# Patient Record
Sex: Female | Born: 1990 | Race: Black or African American | Hispanic: No | Marital: Married | Smoking: Never smoker
Health system: Southern US, Community
[De-identification: ages and names within clinical notes are randomized; demographics above are authoritative.]

## PROBLEM LIST (undated history)

## (undated) DIAGNOSIS — O99810 Abnormal glucose complicating pregnancy: Secondary | ICD-10-CM

## (undated) DIAGNOSIS — E876 Hypokalemia: Secondary | ICD-10-CM

## (undated) DIAGNOSIS — J45909 Unspecified asthma, uncomplicated: Secondary | ICD-10-CM

## (undated) DIAGNOSIS — I2699 Other pulmonary embolism without acute cor pulmonale: Secondary | ICD-10-CM

## (undated) DIAGNOSIS — O1490 Unspecified pre-eclampsia, unspecified trimester: Secondary | ICD-10-CM

## (undated) HISTORY — DX: Unspecified asthma, uncomplicated: J45.909

## (undated) HISTORY — PX: TONSILLECTOMY: SUR1361

---

## 2015-02-14 DIAGNOSIS — Z8759 Personal history of other complications of pregnancy, childbirth and the puerperium: Secondary | ICD-10-CM | POA: Insufficient documentation

## 2015-02-14 DIAGNOSIS — Z8619 Personal history of other infectious and parasitic diseases: Secondary | ICD-10-CM | POA: Insufficient documentation

## 2015-02-14 DIAGNOSIS — Z87440 Personal history of urinary (tract) infections: Secondary | ICD-10-CM | POA: Insufficient documentation

## 2015-05-03 DIAGNOSIS — D251 Intramural leiomyoma of uterus: Secondary | ICD-10-CM | POA: Insufficient documentation

## 2015-08-25 ENCOUNTER — Ambulatory Visit (INDEPENDENT_AMBULATORY_CARE_PROVIDER_SITE_OTHER): Payer: PRIVATE HEALTH INSURANCE | Admitting: Physician Assistant

## 2015-08-25 VITALS — BP 120/78 | HR 84 | Temp 98.1°F | Resp 18 | Ht 59.0 in | Wt 157.0 lb

## 2015-08-25 DIAGNOSIS — R350 Frequency of micturition: Secondary | ICD-10-CM | POA: Diagnosis not present

## 2015-08-25 DIAGNOSIS — N390 Urinary tract infection, site not specified: Secondary | ICD-10-CM

## 2015-08-25 LAB — POCT URINALYSIS DIP (MANUAL ENTRY)
BILIRUBIN UA: NEGATIVE
Glucose, UA: NEGATIVE
LEUKOCYTES UA: NEGATIVE
NITRITE UA: POSITIVE — AB
PH UA: 5.5
Spec Grav, UA: >=1.03
Urobilinogen, UA: 0.2

## 2015-08-25 LAB — POC MICROSCOPIC URINALYSIS (UMFC): Mucus: ABSENT

## 2015-08-25 MED ORDER — NITROFURANTOIN MONOHYD MACRO 100 MG PO CAPS: 100.0000 mg | ORAL_CAPSULE | Freq: Two times a day (BID) | ORAL | Status: DC

## 2015-08-25 NOTE — Progress Notes (Signed)
08/25/2015 at 9:48 AM  Olivia Hale / DOB: 1990-05-05 / MRN: [id number]  SUBJECTIVE Patient is a 25 y.o. female who complains of dysuria, urinary frequency, urinary urgency and odor x 4 days. She denies hematuria, flank pain, abdominal pain and vaginal discharge. Has tried water and cranberry juice with minimal relief. Azo pills did help with the burning. Pt has been having more UTI since her last pregnancy, most recent UTI  was 04/2015. Pt not concerned for STIs as she is married, in a monogamous relationship.   She  has a past medical history of Asthma.    Olivia Hale has No Known Allergies. She  reports that she has never smoked. She has never used smokeless tobacco. She reports that she drinks alcohol. She reports that she does not use illicit drugs. She  reports that she currently engages in sexual activity. She reports using the following method of birth control/protection: IUD. The patient  has no past surgical history on file.  Her family history includes Hyperlipidemia in her father; Hypertension in her father and mother.  Review of Systems  Constitutional: Negative for fever and chills.  Gastrointestinal: Negative for nausea, vomiting, abdominal pain and diarrhea.  Musculoskeletal: Negative for myalgias.    OBJECTIVE  Her  height is 4\' 11"  (1.499 m) and weight is 157 lb (71.215 kg). Her oral temperature is 98.1 F (36.7 C). Her blood pressure is 120/78 and her pulse is 84. Her respiration is 18 and oxygen saturation is 99%.  The patient's body mass index is 31.69 kg/(m^2).  Physical Exam  Vitals reviewed. Constitutional: She is oriented to person, place, and time. She appears well-developed and well-nourished.  HENT:  Head: Normocephalic and atraumatic.  Eyes: Conjunctivae are normal.  Neck: Normal range of motion.  Cardiovascular: Normal rate, regular rhythm and normal heart sounds.   Respiratory: Effort normal and breath sounds normal.  GI: Soft. Normal appearance and bowel  sounds are normal. She exhibits no mass. There is no tenderness. There is no CVA tenderness.  Neurological: She is alert and oriented to person, place, and time.  Skin: Skin is warm and dry.  Psychiatric: She has a normal mood and affect.    Results for orders placed or performed in visit on 08/25/15 (from the past 24 hour(s))  POCT urinalysis dipstick     Status: Abnormal   Collection Time: 08/25/15  9:34 AM  Result Value Ref Range   Color, UA orange (A) yellow   Clarity, UA clear clear   Glucose, UA negative negative   Bilirubin, UA small (A) negative   Ketones, POC UA negative negative   Spec Grav, UA >=1.030    Blood, UA trace-lysed (A) negative   pH, UA 5.5    Protein Ur, POC =30 (A) negative   Urobilinogen, UA 0.2    Nitrite, UA Positive (A) Negative   Leukocytes, UA Negative Negative  POCT Microscopic Urinalysis ([facility])     Status: Abnormal   Collection Time: 08/25/15  9:34 AM  Result Value Ref Range   WBC,UR,HPF,POC Moderate (A) None WBC/hpf   RBC,UR,HPF,POC None None RBC/hpf   Bacteria None None, Too numerous to count   Mucus Absent Absent   Epithelial Cells, UR Per Microscopy Moderate (A) None, Too numerous to count cells/hpf    ASSESSMENT & PLAN  Taraja was seen today for dysuria and urinary frequency.  Diagnoses and all orders for this visit:  Urinary frequency -     POCT urinalysis dipstick -  POCT Microscopic Urinalysis ([facility])  Urinary tract infection, site not specified -     nitrofurantoin, macrocrystal-monohydrate, (MACROBID) 100 MG capsule; Take 1 capsule (100 mg total) by mouth 2 (two) times daily.   The patient was advised to call or come back to clinic if she does not see an improvement in symptoms, or worsens with the above plan.   Tenna Delaine, PA-C [facility] 08/25/2015 9:48 AM

## 2015-08-25 NOTE — Patient Instructions (Addendum)
-Return to clinic if symptoms worsen, do not improve, or as needed  Urinary Tract Infection Urinary tract infections (UTIs) can develop anywhere along your urinary tract. Your urinary tract is your body's drainage system for removing wastes and extra water. Your urinary tract includes two kidneys, two ureters, a bladder, and a urethra. Your kidneys are a pair of bean-shaped organs. Each kidney is about the size of your fist. They are located below your ribs, one on each side of your spine. CAUSES Infections are caused by microbes, which are microscopic organisms, including fungi, viruses, and bacteria. These organisms are so small that they can only be seen through a microscope. Bacteria are the microbes that most commonly cause UTIs. SYMPTOMS  Symptoms of UTIs may vary by age and gender of the patient and by the location of the infection. Symptoms in young women typically include a frequent and intense urge to urinate and a painful, burning feeling in the bladder or urethra during urination. Older women and men are more likely to be tired, shaky, and weak and have muscle aches and abdominal pain. A fever may mean the infection is in your kidneys. Other symptoms of a kidney infection include pain in your back or sides below the ribs, nausea, and vomiting. DIAGNOSIS To diagnose a UTI, your caregiver will ask you about your symptoms. Your caregiver will also ask you to provide a urine sample. The urine sample will be tested for bacteria and white blood cells. White blood cells are made by your body to help fight infection. TREATMENT  Typically, UTIs can be treated with medication. Because most UTIs are caused by a bacterial infection, they usually can be treated with the use of antibiotics. The choice of antibiotic and length of treatment depend on your symptoms and the type of bacteria causing your infection. HOME CARE INSTRUCTIONS  If you were prescribed antibiotics, take them exactly as your  caregiver instructs you. Finish the medication even if you feel better after you have only taken some of the medication.  Drink enough water and fluids to keep your urine clear or pale yellow.  Avoid caffeine, tea, and carbonated beverages. They tend to irritate your bladder.  Empty your bladder often. Avoid holding urine for long periods of time.  Empty your bladder before and after sexual intercourse.  After a bowel movement, women should cleanse from front to back. Use each tissue only once. SEEK MEDICAL CARE IF:   You have back pain.  You develop a fever.  Your symptoms do not begin to resolve within 3 days. SEEK IMMEDIATE MEDICAL CARE IF:   You have severe back pain or lower abdominal pain.  You develop chills.  You have nausea or vomiting.  You have continued burning or discomfort with urination. MAKE SURE YOU:   Understand these instructions.  Will watch your condition.  Will get help right away if you are not doing well or get worse.   This information is not intended to replace advice given to you by your health care provider. Make sure you discuss any questions you have with your health care provider.   Document Released: 11/02/2004 Document Revised: 10/14/2014 Document Reviewed: 03/03/2011 Elsevier Interactive Patient Education 2016 Elsevier Inc.  Nitrofurantoin tablets or capsules What is this medicine? NITROFURANTOIN (nye troe fyoor AN toyn) is an antibiotic. It is used to treat urinary tract infections. This medicine may be used for other purposes; ask your health care provider or pharmacist if you have questions. What should I  tell my health care provider before I take this medicine? They need to know if you have any of these conditions: -anemia -diabetes -glucose-6-phosphate dehydrogenase deficiency -kidney disease -liver disease -lung disease -other chronic illness -an unusual or allergic reaction to nitrofurantoin, other antibiotics, other  medicines, foods, dyes or preservatives -pregnant or trying to get pregnant -breast-feeding How should I use this medicine? Take this medicine by mouth with a glass of water. Follow the directions on the prescription label. Take this medicine with food or milk. Take your doses at regular intervals. Do not take your medicine more often than directed. Do not stop taking except on your doctor's advice. Talk to your pediatrician regarding the use of this medicine in children. While this drug may be prescribed for selected conditions, precautions do apply. Overdosage: If you think you have taken too much of this medicine contact a poison control center or emergency room at once. NOTE: This medicine is only for you. Do not share this medicine with others. What if I miss a dose? If you miss a dose, take it as soon as you can. If it is almost time for your next dose, take only that dose. Do not take double or extra doses. What may interact with this medicine? -antacids containing magnesium trisilicate -probenecid -quinolone antibiotics like ciprofloxacin, lomefloxacin, norfloxacin and ofloxacin -sulfinpyrazone This list may not describe all possible interactions. Give your health care provider a list of all the medicines, herbs, non-prescription drugs, or dietary supplements you use. Also tell them if you smoke, drink alcohol, or use illegal drugs. Some items may interact with your medicine. What should I watch for while using this medicine? Tell your doctor or health care professional if your symptoms do not improve or if you get new symptoms. Drink several glasses of water a day. If you are taking this medicine for a long time, visit your doctor for regular checks on your progress. If you are diabetic, you may get a false positive result for sugar in your urine with certain brands of urine tests. Check with your doctor. What side effects may I notice from receiving this medicine? Side effects that you  should report to your doctor or health care professional as soon as possible: -allergic reactions like skin rash or hives, swelling of the face, lips, or tongue -chest pain -cough -difficulty breathing -dizziness, drowsiness -fever or infection -joint aches or pains -pale or blue-tinted skin -redness, blistering, peeling or loosening of the skin, including inside the mouth -tingling, burning, pain, or numbness in hands or feet -unusual bleeding or bruising -unusually weak or tired -yellowing of eyes or skin Side effects that usually do not require medical attention (report to your doctor or health care professional if they continue or are bothersome): -dark urine -diarrhea -headache -loss of appetite -nausea or vomiting -temporary hair loss This list may not describe all possible side effects. Call your doctor for medical advice about side effects. You may report side effects to FDA at 1-800-FDA-1088. Where should I keep my medicine? Keep out of the reach of children. Store at room temperature between 15 and 30 degrees C (59 and 86 degrees F). Protect from light. Throw away any unused medicine after the expiration date. NOTE: This sheet is a summary. It may not cover all possible information. If you have questions about this medicine, talk to your doctor, pharmacist, or health care provider.    2016, Elsevier/Gold Standard. (2007-08-14 15:56:47)   IF you received an x-ray today, you will  receive an Pharmacologist from [facility]. Please contact [facility] at [phone] with questions or concerns regarding your invoice.   IF you received labwork today, you will receive an invoice from [employer]. Please contact [facility] at [phone] with questions or concerns regarding your invoice.   Our billing staff will not be able to assist you with questions regarding bills from these companies.  You will be contacted with the lab results as soon  as they are available. The fastest way to get your results is to activate your My Chart account. Instructions are located on the last page of this paperwork. If you have not heard from [place] regarding the results in 2 weeks, please contact this office.

## 2016-03-19 ENCOUNTER — Encounter (HOSPITAL_COMMUNITY): Payer: Self-pay | Admitting: *Deleted

## 2016-03-19 ENCOUNTER — Inpatient Hospital Stay (HOSPITAL_COMMUNITY)
Admission: AD | Admit: 2016-03-19 | Discharge: 2016-03-19 | Disposition: A | Discharge status: Home / Self Care | Payer: PRIVATE HEALTH INSURANCE | Source: Ambulatory Visit | Attending: Obstetrics & Gynecology | Admitting: Obstetrics & Gynecology

## 2016-03-19 DIAGNOSIS — Z79899 Other long term (current) drug therapy: Secondary | ICD-10-CM | POA: Diagnosis not present

## 2016-03-19 DIAGNOSIS — M549 Dorsalgia, unspecified: Secondary | ICD-10-CM | POA: Diagnosis not present

## 2016-03-19 DIAGNOSIS — N898 Other specified noninflammatory disorders of vagina: Secondary | ICD-10-CM | POA: Diagnosis not present

## 2016-03-19 DIAGNOSIS — N390 Urinary tract infection, site not specified: Secondary | ICD-10-CM | POA: Diagnosis present

## 2016-03-19 DIAGNOSIS — R300 Dysuria: Secondary | ICD-10-CM | POA: Insufficient documentation

## 2016-03-19 LAB — WET PREP, GENITAL
CLUE CELLS WET PREP: NONE SEEN
Sperm: NONE SEEN
Trich, Wet Prep: NONE SEEN
Yeast Wet Prep HPF POC: NONE SEEN

## 2016-03-19 LAB — URINALYSIS, ROUTINE W REFLEX MICROSCOPIC
Bilirubin Urine: NEGATIVE
GLUCOSE, UA: NEGATIVE mg/dL
Ketones, ur: NEGATIVE mg/dL
NITRITE: NEGATIVE
PROTEIN: 100 mg/dL — AB
SPECIFIC GRAVITY, URINE: 1.028 (ref 1.005–1.030)
pH: 5 (ref 5.0–8.0)

## 2016-03-19 LAB — POCT PREGNANCY, URINE: PREG TEST UR: NEGATIVE

## 2016-03-19 MED ORDER — NITROFURANTOIN MONOHYD MACRO 100 MG PO CAPS: 100.0000 mg | ORAL_CAPSULE | Freq: Two times a day (BID) | ORAL | 0 refills | Status: AC

## 2016-03-19 NOTE — MAU Provider Note (Signed)
History     CSN: [id number]  Arrival date and time: 03/19/16 1133   First Provider Initiated Contact with Patient 03/19/16 1203      Chief Complaint  Patient presents with  . Urinary Tract Infection   Non-pregnant female here with dysuria, urine odor, and vaginal discharge. Urinary sx started 1 month ago. She tried increased water and cranberry juice and tabs with no resolution. She also used AZO today. She reports yellow, blood streaked vaginal discharge 2 days ago with malodor. No new sexual partners in 4 years.    Pertinent Gynecological History: Menses: 03/11/16 Bleeding: intermenstrual bleeding Contraception: IUD Sexually transmitted diseases: past history: GC, remote Last pap: normal Date: 2016   Past Medical History:  Diagnosis Date  . Asthma     Past Surgical History:  Procedure Laterality Date  . TONSILLECTOMY      Family History  Problem Relation Age of Onset  . Hypertension Mother   . Hypertension Father   . Hyperlipidemia Father     Social History  Substance Use Topics  . Smoking status: Never Smoker  . Smokeless tobacco: Never Used  . Alcohol use 0.0 oz/week     Comment: occasional    Allergies: No Known Allergies  Prescriptions Prior to Admission  Medication Sig Dispense Refill Last Dose  . acetaminophen (TYLENOL) 500 MG tablet Take 1,000 mg by mouth every 6 (six) hours as needed.   03/19/2016 at 1100  . albuterol (PROAIR HFA) 108 (90 Base) MCG/ACT inhaler Inhale into the lungs.   More than a month at Unknown time  . levonorgestrel (MIRENA) 20 MCG/24HR IUD 1 each by Intrauterine route once.   Taking  . nitrofurantoin, macrocrystal-monohydrate, (MACROBID) 100 MG capsule Take 1 capsule (100 mg total) by mouth 2 (two) times daily. 10 capsule 0     Review of Systems  Constitutional: Negative for fever.  Gastrointestinal: Negative.   Genitourinary: Positive for dysuria, frequency, urgency and vaginal discharge. Negative for hematuria and vaginal  bleeding.  Musculoskeletal: Positive for back pain.   Physical Exam   Blood pressure 139/79, pulse 76, temperature 98.7 F (37.1 C), resp. rate 16, height 5\' 1"  (1.549 m), weight 74.4 kg (164 lb), last menstrual period 03/11/2016.  Physical Exam  Constitutional: She is oriented to person, place, and time. She appears well-developed and well-nourished. No distress.  HENT:  Head: Normocephalic and atraumatic.  Neck: Normal range of motion.  Cardiovascular: Normal rate.   Respiratory: Effort normal.  Genitourinary:  Genitourinary Comments: External: no lesions or erythema Vagina: rugated, parous, scant brown discharge    Musculoskeletal: Normal range of motion.  Neurological: She is alert and oriented to person, place, and time.  Skin: Skin is warm and dry.  Psychiatric: She has a normal mood and affect.   Results for orders placed or performed during the hospital encounter of 03/19/16 (from the past 24 hour(s))  Urinalysis, Routine w reflex microscopic     Status: Abnormal   Collection Time: 03/19/16 11:37 AM  Result Value Ref Range   Color, Urine YELLOW YELLOW   APPearance HAZY (A) CLEAR   Specific Gravity, Urine 1.028 1.005 - 1.030   pH 5.0 5.0 - 8.0   Glucose, UA NEGATIVE NEGATIVE mg/dL   Hgb urine dipstick SMALL (A) NEGATIVE   Bilirubin Urine NEGATIVE NEGATIVE   Ketones, ur NEGATIVE NEGATIVE mg/dL   Protein, ur 100 (A) NEGATIVE mg/dL   Nitrite NEGATIVE NEGATIVE   Leukocytes, UA SMALL (A) NEGATIVE   RBC / HPF TOO  NUMEROUS TO COUNT 0 - 5 RBC/hpf   WBC, UA 6-30 0 - 5 WBC/hpf   Bacteria, UA RARE (A) NONE SEEN   Squamous Epithelial / LPF 6-30 (A) NONE SEEN   Mucous PRESENT   Pregnancy, urine POC     Status: None   Collection Time: 03/19/16 11:52 AM  Result Value Ref Range   Preg Test, Ur NEGATIVE NEGATIVE  Wet prep, genital     Status: Abnormal   Collection Time: 03/19/16 12:10 PM  Result Value Ref Range   Yeast Wet Prep HPF POC NONE SEEN NONE SEEN   Trich, Wet Prep  NONE SEEN NONE SEEN   Clue Cells Wet Prep HPF POC NONE SEEN NONE SEEN   WBC, Wet Prep HPF POC MANY (A) NONE SEEN   Sperm NONE SEEN    MAU Course  Procedures  MDM Labs ordered and reviewed. Suspected UTI based on sx and UA. Wet prep neg, GC/CT pending. Stable for discharge home.  Assessment and Plan   1. Dysuria   2. Vaginal discharge    Discharge home Increase water intake Continue AZO and cranberry Rx Macrobid Follow up with PCP as needed Return to MAU for OBGYN emergencies  Allergies as of 03/19/2016   No Known Allergies     Medication List    TAKE these medications   acetaminophen 500 MG tablet Commonly known as:  TYLENOL Take 1,000 mg by mouth every 6 (six) hours as needed for mild pain, moderate pain or headache.   Cranberry 1000 MG Caps Take 2,000 mg by mouth daily.   levonorgestrel 20 MCG/24HR IUD Commonly known as:  MIRENA 1 each by Intrauterine route once.   nitrofurantoin (macrocrystal-monohydrate) 100 MG capsule Commonly known as:  MACROBID Take 1 capsule (100 mg total) by mouth 2 (two) times daily.   PROAIR HFA 108 (90 Base) MCG/ACT inhaler Generic drug:  albuterol Inhale 1-2 puffs into the lungs every 6 (six) hours as needed for wheezing or shortness of breath.      Julianne Handler, CNM 03/19/2016, 12:04 PM

## 2016-03-19 NOTE — MAU Note (Signed)
Pt presents to MAU with complaints of painful urination, odor to urine with yellow discharge for approximately a month

## 2016-03-19 NOTE — Discharge Instructions (Signed)
Dysuria Introduction Dysuria is pain or discomfort while urinating. The pain or discomfort may be felt in the tube that carries urine out of the bladder (urethra) or in the surrounding tissue of the genitals. The pain may also be felt in the groin area, lower abdomen, and lower back. You may have to urinate frequently or have the sudden feeling that you have to urinate (urgency). Dysuria can affect both men and women, but is more common in women. Dysuria can be caused by many different things, including:  Urinary tract infection in women.  Infection of the kidney or bladder.  Kidney stones or bladder stones.  Certain sexually transmitted infections (STIs), such as chlamydia.  Dehydration.  Inflammation of the vagina.  Use of certain medicines.  Use of certain soaps or scented products that cause irritation. Follow these instructions at home: Watch your dysuria for any changes. The following actions may help to reduce any discomfort you are feeling:  Drink enough fluid to keep your urine clear or pale yellow.  Empty your bladder often. Avoid holding urine for long periods of time.  After a bowel movement or urination, women should cleanse from front to back, using each tissue only once.  Empty your bladder after sexual intercourse.  Take medicines only as directed by your health care provider.  If you were prescribed an antibiotic medicine, finish it all even if you start to feel better.  Avoid caffeine, tea, and alcohol. They can irritate the bladder and make dysuria worse. In men, alcohol may irritate the prostate.  Keep all follow-up visits as directed by your health care provider. This is important.  If you had any tests done to find the cause of dysuria, it is your responsibility to obtain your test results. Ask the lab or department performing the test when and how you will get your results. Talk with your health care provider if you have any questions about your  results. Contact a health care provider if:  You develop pain in your back or sides.  You have a fever.  You have nausea or vomiting.  You have blood in your urine.  You are not urinating as often as you usually do. Get help right away if:  You pain is severe and not relieved with medicines.  You are unable to hold down any fluids.  You or someone else notices a change in your mental function.  You have a rapid heartbeat at rest.  You have shaking or chills.  You feel extremely weak. This information is not intended to replace advice given to you by your health care provider. Make sure you discuss any questions you have with your health care provider. Document Released: 10/22/2003 Document Revised: 07/01/2015 Document Reviewed: 09/18/2013  2017 Elsevier  

## 2016-03-20 LAB — GC/CHLAMYDIA PROBE AMP (~~LOC~~) NOT AT ARMC
Chlamydia: NEGATIVE
NEISSERIA GONORRHEA: NEGATIVE

## 2016-03-21 LAB — URINE CULTURE: Culture: 40000 — AB

## 2016-05-29 ENCOUNTER — Emergency Department (HOSPITAL_COMMUNITY)
Admission: EM | Admit: 2016-05-29 | Discharge: 2016-05-29 | Disposition: A | Discharge status: Home / Self Care | Payer: PRIVATE HEALTH INSURANCE | Attending: Emergency Medicine | Admitting: Emergency Medicine

## 2016-05-29 ENCOUNTER — Encounter (HOSPITAL_COMMUNITY): Payer: Self-pay | Admitting: *Deleted

## 2016-05-29 DIAGNOSIS — L0231 Cutaneous abscess of buttock: Secondary | ICD-10-CM | POA: Diagnosis not present

## 2016-05-29 DIAGNOSIS — R319 Hematuria, unspecified: Secondary | ICD-10-CM | POA: Insufficient documentation

## 2016-05-29 DIAGNOSIS — Z79899 Other long term (current) drug therapy: Secondary | ICD-10-CM | POA: Diagnosis not present

## 2016-05-29 DIAGNOSIS — L0291 Cutaneous abscess, unspecified: Secondary | ICD-10-CM

## 2016-05-29 DIAGNOSIS — N390 Urinary tract infection, site not specified: Secondary | ICD-10-CM | POA: Diagnosis not present

## 2016-05-29 DIAGNOSIS — J45909 Unspecified asthma, uncomplicated: Secondary | ICD-10-CM | POA: Diagnosis not present

## 2016-05-29 LAB — URINALYSIS, ROUTINE W REFLEX MICROSCOPIC
BILIRUBIN URINE: NEGATIVE
Glucose, UA: NEGATIVE mg/dL
Ketones, ur: NEGATIVE mg/dL
Nitrite: NEGATIVE
Protein, ur: 30 mg/dL — AB
SPECIFIC GRAVITY, URINE: 1.03 (ref 1.005–1.030)
pH: 6 (ref 5.0–8.0)

## 2016-05-29 LAB — PREGNANCY, URINE: PREG TEST UR: NEGATIVE

## 2016-05-29 MED ORDER — SULFAMETHOXAZOLE-TRIMETHOPRIM 800-160 MG PO TABS: 1.0000 | ORAL_TABLET | Freq: Two times a day (BID) | ORAL | 0 refills | Status: AC

## 2016-05-29 MED ORDER — CEPHALEXIN 250 MG PO CAPS: 250.0000 mg | ORAL_CAPSULE | Freq: Four times a day (QID) | ORAL | 0 refills | Status: AC

## 2016-05-29 MED ORDER — LIDOCAINE HCL (PF) 1 % IJ SOLN
2.0000 mL | Freq: Once | INTRAMUSCULAR | Status: AC
Start: 2016-05-29 — End: 2016-05-29
  Administered 2016-05-29: 30 mL
  Filled 2016-05-29: qty 30

## 2016-05-29 MED ORDER — OXYCODONE-ACETAMINOPHEN 5-325 MG PO TABS
1.0000 | ORAL_TABLET | Freq: Once | ORAL | Status: AC
  Administered 2016-05-29: 1 via ORAL
  Filled 2016-05-29: qty 1

## 2016-05-29 NOTE — Discharge Instructions (Signed)
Take Keflex and Bactrim (antibiotics) as prescribed--take with food to prevent GI upset. Take Ibuprofen as needed for pain--follow over the counter label instructions. Continue to keep area clean and dry; do not soak for the next 24 hours. Call to schedule a follow up appointment with your primary care doctor within 1 week. Return to ER if you experience fevers, chills, dizziness, chest pain, shortness of breath, abdominal pain, nausea/vomiting/diarrhea, vaginal discharge, vaginal bleeding, increased redness/swelling/pain to area, worsening symptoms, or any additional concerns.

## 2016-05-29 NOTE — ED Notes (Signed)
Pt went to urgent care yesterday where she was dx with a boil on the right side of her perineum. No medications given. This morning pt stated seeing blood when wiping.

## 2016-05-29 NOTE — ED Triage Notes (Signed)
Pt c/o pain in the right labial area. Reports this morning that she wiped and noticed some blood. No purulent drainage. She denies fevers.

## 2016-05-29 NOTE — ED Provider Notes (Signed)
[facility] DEPT Provider Note   CSN: [id number] Arrival date & time: 05/29/16  0204     History   Chief Complaint Chief Complaint  Patient presents with  . Abscess    HPI Olivia Hale is a 26 y.o. female.  Pt is a 26 y/o F with PMH of asthma who presents to ED for pain to area below R labia, onset 3 days ago, tender to palpation, reports went to wipe this morning and noted pus on toilet paper, has tried Ibuprofen and ice with mild relief. Denies hx of abscess or MRSA. Denies vaginal discharge, vaginal bleeding, dysuria, or hematuria. Reports she is sexually active with one female partner, monogamous, does not use protection, denies hx of STDs. Denies fevers, chills, dizziness, CP, SOB, cough, abd pain, n/v/d, dysuria, urinary frequency,  hematuria, vaginal discharge/bleeding, or any additional concerns. IUD in place, LMP approx 3 months ago.       Past Medical History:  Diagnosis Date  . Asthma     Patient Active Problem List   Diagnosis Date Noted  . 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 H/O urinary tract infection 02/14/2015  . H/O previous obstetrical problem 02/14/2015  . Personal history of other infectious and parasitic diseases 02/14/2015    Past Surgical History:  Procedure Laterality Date  . TONSILLECTOMY      OB History    Gravida Para Term Preterm AB Living   5 3 3   2 3    SAB TAB Ectopic Multiple Live Births   2               Home Medications    Prior to Admission medications   Medication Sig Start Date End Date Taking? Authorizing Provider  acetaminophen (TYLENOL) 500 MG tablet Take 1,000 mg by mouth every 6 (six) hours as needed for mild pain, moderate pain or headache.     Historical Provider, MD  albuterol (PROAIR HFA) 108 (90 Base) MCG/ACT inhaler Inhale 1-2 puffs into the lungs every 6 (six) hours as needed for wheezing or shortness of breath.     Historical Provider, MD  cephALEXin (KEFLEX) 250 MG capsule Take 1 capsule (250 mg total) by mouth  4 (four) times daily. 05/29/16   Bernadene Bell Wojeck, NP  Cranberry 1000 MG CAPS Take 2,000 mg by mouth daily.    Historical Provider, MD  levonorgestrel (MIRENA) 20 MCG/24HR IUD 1 each by Intrauterine route once.    Historical Provider, MD  sulfamethoxazole-trimethoprim (BACTRIM DS,SEPTRA DS) 800-160 MG tablet Take 1 tablet by mouth 2 (two) times daily. 05/29/16 06/05/16  Ulice Bold, NP    Family History Family History  Problem Relation Age of Onset  . Hypertension Mother   . Hypertension Father   . Hyperlipidemia Father     Social History Social History  Substance Use Topics  . Smoking status: Never Smoker  . Smokeless tobacco: Never Used  . Alcohol use 0.0 oz/week     Comment: occasional     Allergies   Patient has no known allergies.   Review of Systems Review of Systems  Constitutional: Negative for chills, fever and unexpected weight change.  Respiratory: Negative for cough and shortness of breath.   Cardiovascular: Negative for chest pain.  Gastrointestinal: Negative for abdominal pain, diarrhea, nausea and vomiting.  Genitourinary: Positive for vaginal pain. Negative for dysuria, flank pain, frequency, hematuria, vaginal bleeding and vaginal discharge.  Musculoskeletal: Negative for back pain, neck pain and neck stiffness.  Skin: Negative for rash.  Neurological:  Negative for dizziness, weakness, numbness and headaches.     Physical Exam Updated Vital Signs BP 110/75 (BP Location: Left Arm)   Pulse 96   Temp 98.5 F (36.9 C) (Oral)   Resp 18   Ht 5' (1.524 m)   Wt 73.5 kg   SpO2 99%   BMI 31.64 kg/m   Physical Exam  Constitutional: She is oriented to person, place, and time. She appears well-developed and well-nourished.  HENT:  Head: Normocephalic and atraumatic.  Mouth/Throat: Oropharynx is clear and moist.  Eyes: Conjunctivae and EOM are normal. Pupils are equal, round, and reactive to light.  Neck: Normal range of motion. Neck supple.    Cardiovascular: Normal rate, regular rhythm, normal heart sounds and intact distal pulses.   No murmur heard. Pulmonary/Chest: Effort normal and breath sounds normal. She has no wheezes.  Abdominal: Soft. Bowel sounds are normal. There is no tenderness. There is no rebound and no guarding.  Genitourinary:  Genitourinary Comments: +1.5cm x 1.5cm abscess to R gluteal fold with mild surrounding erythema and induration. +scant active purulent drainage. Deferred internal GU and rectal exam.   Musculoskeletal: Normal range of motion.  Lymphadenopathy:       Right: No inguinal adenopathy present.       Left: No inguinal adenopathy present.  Neurological: She is alert and oriented to person, place, and time.  Strength 5/5 to bilateral UE and LE, gait steady while ambulating around pod.   Skin: Skin is warm and dry.  Psychiatric: She has a normal mood and affect. Her behavior is normal.  Nursing note and vitals reviewed.    ED Treatments / Results  Labs (all labs ordered are listed, but only abnormal results are displayed) Labs Reviewed  URINE CULTURE - Abnormal; Notable for the following:       Result Value   Culture >=100,000 COLONIES/mL ESCHERICHIA COLI (*)    Organism ID, Bacteria ESCHERICHIA COLI (*)    All other components within normal limits  URINALYSIS, ROUTINE W REFLEX MICROSCOPIC - Abnormal; Notable for the following:    APPearance CLOUDY (*)    Hgb urine dipstick SMALL (*)    Protein, ur 30 (*)    Leukocytes, UA SMALL (*)    Bacteria, UA MANY (*)    Squamous Epithelial / LPF 6-30 (*)    All other components within normal limits  AEROBIC CULTURE (SUPERFICIAL SPECIMEN)  PREGNANCY, URINE    EKG  EKG Interpretation None       Radiology No results found.  Procedures .Marland KitchenIncision and Drainage Date/Time: 05/29/2016 8:27 AM Performed by: Ulice Bold Authorized by: Ulice Bold   Consent:    Consent obtained:  Verbal   Consent given by:  Patient   Risks  discussed:  Bleeding, damage to other organs, infection, incomplete drainage and pain   Alternatives discussed:  No treatment, delayed treatment, alternative treatment, observation and referral Universal protocol:    Procedure explained and questions answered to patient or proxy's satisfaction: yes     Relevant documents present and verified: yes     Required blood products, implants, devices, and special equipment available: yes     Site/side marked: yes     Immediately prior to procedure a time out was called: yes     Patient identity confirmed:  Verbally with patient, arm band and hospital-assigned identification number Location:    Type:  Abscess   Size:  1.5cmx1.5cm   Location:  Anogenital   Anogenital location: R gluetal fold.  Pre-procedure details:    Skin preparation:  Betadine Anesthesia (see MAR for exact dosages):    Anesthesia method:  Local infiltration   Local anesthetic:  Lidocaine 1% w/o epi Procedure type:    Complexity:  Simple Procedure details:    Incision types:  Single straight   Incision depth:  Dermal   Scalpel blade:  11   Wound management:  Probed and deloculated, irrigated with saline, extensive cleaning and debrided   Drainage:  Purulent   Drainage amount:  Moderate   Wound treatment:  Wound left open   Packing materials:  None Post-procedure details:    Patient tolerance of procedure:  Tolerated well, no immediate complications    (including critical care time)  Medications Ordered in ED Medications  oxyCODONE-acetaminophen (PERCOCET/ROXICET) 5-325 MG per tablet 1 tablet (1 tablet Oral Given 05/29/16 0800)  lidocaine (PF) (XYLOCAINE) 1 % injection 2 mL (30 mLs Infiltration Given 05/29/16 0800)     Initial Impression / Assessment and Plan / ED Course  I have reviewed the triage vital signs and the nursing notes.  Pertinent labs & imaging results that were available during my care of the patient were reviewed by me and considered in my medical  decision making (see chart for details).     Pt presents to ED for abscess to R gluteal fold with mild surrounding cellulitis. Pt afebrile, not concerned with sepsis. Pt deferred GU exam, will get UA for infection and perform I& D. Plan to Dc with antibiotics and PCP and OBGYN follow up; pt amenable to this plan.   8:28 AM Pt tolerated I&D well, moderate purulent drainage; pending UA.  9:57 AM UA reflux to culture with small leuks, many bacteria; bactrim for abscess will cover UTI. Discussed results, discharge instructions, rx and safety, wound care, s/s infection, return precautions, and follow up. Pt verbalizes understanding using verbal teachback and agrees with plan, denies any additional concerns.    Final Clinical Impressions(s) / ED Diagnoses   Final diagnoses:  Abscess  Urinary tract infection with hematuria, site unspecified    New Prescriptions Discharge Medication List as of 05/29/2016  9:59 AM    START taking these medications   Details  cephALEXin (KEFLEX) 250 MG capsule Take 1 capsule (250 mg total) by mouth 4 (four) times daily., Starting Mon 05/29/2016, Print    sulfamethoxazole-trimethoprim (BACTRIM DS,SEPTRA DS) 800-160 MG tablet Take 1 tablet by mouth 2 (two) times daily., Starting Mon 05/29/2016, Until Mon 06/05/2016, [place], NP 05/31/16 2334    Virgel Manifold, MD 06/01/16 1515

## 2016-05-29 NOTE — ED Notes (Signed)
Bed: WA05 Expected date:  Expected time:  Means of arrival:  Comments: 

## 2016-05-31 LAB — AEROBIC CULTURE  (SUPERFICIAL SPECIMEN): CULTURE: NORMAL

## 2016-05-31 LAB — AEROBIC CULTURE W GRAM STAIN (SUPERFICIAL SPECIMEN)

## 2016-05-31 LAB — URINE CULTURE

## 2016-06-01 ENCOUNTER — Telehealth: Payer: Self-pay | Admitting: *Deleted

## 2016-06-01 NOTE — Telephone Encounter (Signed)
Post ED Visit - Positive Culture Follow-up  Culture report reviewed by antimicrobial stewardship pharmacist:  []  Elenor Quinones, Pharm.D. [x]  Heide Guile, Pharm.D., BCPS AQ-ID []  Parks Neptune, Pharm.D., BCPS []  Alycia Rossetti, Pharm.D., BCPS []  [place], [place].D., BCPS, AAHIVP []  Legrand Como, Pharm.D., BCPS, AAHIVP []  Salome Arnt, PharmD, BCPS []  Dimitri Ped, PharmD, BCPS []  Vincenza Hews, PharmD, BCPS  Positive urine culture Treated with Cephalexin and Sulfamethoxazole-Trimethoprim, organism sensitive to the same and no further patient follow-up is required at this time.  Harlon Flor [facility] 06/01/2016, 12:18 PM

## 2018-02-06 NOTE — L&D Delivery Note (Signed)
Delivery Summary    Patient: Debra Warren MRN: [id number]  SSN: [id number]    Date of Birth: 04/08/1990  Age: 28 y.o.  Sex: female        Information for the patient's newborn:  Kray, GIRL  Debra C [[id number]]       Labor Events:   Preterm Labor: No   Antenatal Steroids:     Cervical Ripening Date/Time: 02/03/2019 1:50 PM   Cervical Ripening Type: Misoprostol   Antibiotics During Labor:     Rupture Identifier:      Rupture Date/Time: 02/03/2019 1:50 PM   Rupture Type: AROM   Amniotic Fluid Volume: Large    Amniotic Fluid Description:      Amniotic Fluid Odor:      Induction: Oxytocin       Induction Date/Time: 02/02/2019 5:50 AM    Indications for Induction: Mild Preeclampsia    Augmentation:     Augmentation Date/Time:      Indications for Augmentation:     Labor complications: Other (comment)   cord prolapse   Additional complications:        Delivery Events:  Indications For Episiotomy:     Episiotomy: None   Perineal Laceration(s): None   Repaired:     Periurethral Laceration Location:      Repaired:     Labial Laceration Location:     Repaired:     Sulcal Laceration Location:     Repaired:     Vaginal Laceration Location:     Repaired:     Cervical Laceration Location:     Repaired:     Repair Suture: None   Number of Repair Packets:     Estimated Blood Loss (ml):  ml   Quantitaive Blood Loss (ml):             Delivery Date: 02/03/2019    Delivery Time: 2:03 PM   Delivery Type: C-Section, Low Transverse    C-Section Details    Trial of Labor: No   Primary/Repeat: Primary   Priority: Emergency   Indications:  Cord Prolapse       Sex:  Female     Gestational Age: [redacted]w[redacted]d  Delivery Clinician:  Zeda Gangwer A Joyia Riehle  Living Status: Living   Delivery Location: L&D  OR OR 2          APGARS  One minute Five minutes Ten minutes   Skin color: 0   1   1     Heart rate: 2   2   2     Grimace: 1   2   2     Muscle tone: 0   0   1     Breathing: 0   2   2     Totals: 3   7   8       Presentation: Compound    Position:         Resuscitation Method:  Suctioning-bulb;Tactile Stimulation;PPV;Oxygen     Meconium Stained:        Cord Information: 3 Vessels  Complications: Prolapsed  Cord around:    Delayed cord clamping?    Cord clamped date/time:02/03/2019  2:03 PM  Disposition of Cord Blood: Lab    Blood Gases Sent?: Yes    Placenta:  Date/Time: 02/03/2019  2:05 PM  Removal: Expressed      Appearance: Normal;Intact     Newborn Measurements:  Birth Weight:        Birth Length:          Head Circumference:        Chest Circumference:       Abdominal Girth:      Other Providers:   Sharin Altidor A;HUGGINS, SARAH;LEE, JANET;MILLER, SUSAN;KLAPPERICH, MARK A;DZHUR, OKSANA P;ARNETT, WHITNEY;CASTEEL, KRISTEN M, Obstetrician;Primary Nurse;Primary Newborn Nurse;Neonatologist;Crna;Respiratory Therapist;Charge Nurse;Staff Nurse             Group B Strep: No results found for: GRBSEXT, GRBSEXT  Information for the patient's newborn:  Rekha, Hobbins [[id number]]     Lab Results   Component Value Date/Time    ABO/Rh(D) A POSITIVE 02/03/2019 02:03 PM    DAT IgG NEG 02/03/2019 02:03 PM      Recent Labs     02/03/19  1425 02/03/19  1424   PCO2CB 47 130*   PO2CB 46 <5   HCO3I  --  23.0   SO2I  --  Cannot be calculated   IBD 4 13   SPECTI VENOUS CORD ARTERIAL CORD   PHICB 7.30 6.86*   ISITE CORD CORD   IDEV ROOM AIR ROOM AIR   IALLEN NOT APPLICABLE NOT APPLICABLE

## 2018-02-06 NOTE — L&D Delivery Note (Signed)
Delivery Summary    Patient: Debra Warren MRN: [id number]  SSN: [id number]    Date of Birth: [birth year]  Age: 28 y.o.  Sex: female        Information for the patient's newborn:  Karah, Caruthers [[id number]]       Labor Events:   Preterm Labor: No   Antenatal Steroids:     Cervical Ripening Date/Time: 02/03/2019 1:50 PM   Cervical Ripening Type: Misoprostol   Antibiotics During Labor:     Rupture Identifier:      Rupture Date/Time: 02/03/2019 1:50 PM   Rupture Type: AROM   Amniotic Fluid Volume: Large    Amniotic Fluid Description:      Amniotic Fluid Odor:      Induction: Oxytocin       Induction Date/Time: 02/02/2019 5:50 AM    Indications for Induction: Mild Preeclampsia    Augmentation:     Augmentation Date/Time:      Indications for Augmentation:     Labor complications: Other (comment)   cord prolapse   Additional complications:        Delivery Events:  Indications For Episiotomy:     Episiotomy: None   Perineal Laceration(s): None   Repaired:     Periurethral Laceration Location:      Repaired:     Labial Laceration Location:     Repaired:     Sulcal Laceration Location:     Repaired:     Vaginal Laceration Location:     Repaired:     Cervical Laceration Location:     Repaired:     Repair Suture: None   Number of Repair Packets:     Estimated Blood Loss (ml):  ml   Quantitaive Blood Loss (ml):             Delivery Date: 02/03/2019    Delivery Time: 2:03 PM   Delivery Type: C-Section, Low Transverse    C-Section Details    Trial of Labor: No   Primary/Repeat: Primary   Priority: Emergency   Indications:  Cord Prolapse       Sex:  Female     Gestational Age: [redacted]w[redacted]d  Delivery Clinician:  Adela Ports  Living Status: Living   Delivery Location: L&D  OR OR 2          APGARS  One minute Five minutes Ten minutes   Skin color: 0   1   1     Heart rate: 2   2   2      Grimace: 1   2   2      Muscle tone: 0   0   1     Breathing: 0   2   2     Totals: 3   7   8        Presentation: Compound    Position:         Resuscitation Method:  Suctioning-bulb;Tactile Stimulation;PPV;Oxygen     Meconium Stained:        Cord Information: 3 Vessels  Complications: Prolapsed  Cord around:    Delayed cord clamping?    Cord clamped date/time:02/03/2019  2:03 PM  Disposition of Cord Blood: Lab    Blood Gases Sent?: Yes    Placenta:  Date/Time: 02/03/2019  2:05 PM  Removal: Expressed      Appearance: Normal;Intact     Newborn Measurements:  Birth Weight:        Birth Length:  Head Circumference:        Chest Circumference:       Abdominal Girth:      Other Providers:   Sharin Altidor A;HUGGINS, SARAH;LEE, JANET;MILLER, SUSAN;KLAPPERICH, MARK A;DZHUR, OKSANA P;ARNETT, WHITNEY;CASTEEL, KRISTEN M, Obstetrician;Primary Nurse;Primary Newborn Nurse;Neonatologist;Crna;Respiratory Therapist;Charge Nurse;Staff Nurse             Group B Strep: No results found for: GRBSEXT, GRBSEXT  Information for the patient's newborn:  Rekha, Hobbins [[id number]]     Lab Results   Component Value Date/Time    ABO/Rh(D) A POSITIVE 02/03/2019 02:03 PM    DAT IgG NEG 02/03/2019 02:03 PM      Recent Labs     02/03/19  1425 02/03/19  1424   PCO2CB 47 130*   PO2CB 46 <5   HCO3I  --  23.0   SO2I  --  Cannot be calculated   IBD 4 13   SPECTI VENOUS CORD ARTERIAL CORD   PHICB 7.30 6.86*   ISITE CORD CORD   IDEV ROOM AIR ROOM AIR   IALLEN NOT APPLICABLE NOT APPLICABLE

## 2018-09-09 ENCOUNTER — Other Ambulatory Visit: Attending: Women's Health

## 2018-09-09 ENCOUNTER — Other Ambulatory Visit: Admit: 2018-09-09 | Discharge: 2018-09-09 | Payer: PRIVATE HEALTH INSURANCE | Attending: Women's Health

## 2018-09-09 DIAGNOSIS — O0932 Supervision of pregnancy with insufficient antenatal care, second trimester: Secondary | ICD-10-CM

## 2018-09-09 MED ORDER — ASPIRIN 81 MG TAB, DELAYED RELEASE
81 mg | ORAL_TABLET | Freq: Every day | ORAL | 11 refills | Status: DC
Start: 2018-09-09 — End: 2019-02-04

## 2018-09-09 NOTE — Assessment & Plan Note (Signed)
History reviewed  PNV's ordered (if not already taking)  PN labs, UDS ordered  Genetic testing - CF, SMA, tetra today  D/W pt at length genetic testing that is recommended by ACOG -- NIPT, Quad screen (for trisomies and NTD), CF, SMA.  Brief discussion of these diseases - conditions that may increase risks, etiology, carrier states, fetal effects, treatment options, etc was undertaken. D/W pt that these are screening tests/carrier screening test only and are NOT mandatory. We also discuss false POS/false neg rates. It is her decision whether to have them done and how to proceed with the information afterwards.  All questions answered, pt understood and wishes to proceed with indicated tests.      Problem list reviewed  OB physical completed  OB prenatal packet/education reviewed  RTO 4 weeks with ANA US  PTL/labor precautions, FMC, and pregnancy warning signs reviewed.

## 2018-09-09 NOTE — Progress Notes (Signed)
This is a 28 y.o.  G6 P2123 at [redacted]w[redacted]d for routine OB visit.    Her Estimated Due Date is 02/23/2019, by Last Menstrual Period    Denies leaking of fluid, vaginal bleeding, or regular contractions.Denies severe or persistent HA, vision changes, RUQ/epigastric pain, N/V or swelling.      Current Outpatient Medications on File Prior to Visit   Medication Sig Dispense Refill   ??? albuterol (PROVENTIL HFA, VENTOLIN HFA, PROAIR HFA) 90 mcg/actuation inhaler Take  by inhalation.     ??? PNV no.153/FA/om3/dha/epa/fish (PRENATAL GUMMIES PO) Take  by mouth.       No current facility-administered medications on file prior to visit.        No Known Allergies    OB History   Gravida Para Term Preterm AB Living   6 3 2 1 2 3    SAB TAB Ectopic Molar Multiple Live Births   0 0 0 0 0 3       # 1 - Date: None, Sex: None, Weight: None, GA: None, Delivery: None, Apgar1: None, Apgar5: None, Living: None, Birth Comments: None    # 2 - Date: None, Sex: None, Weight: None, GA: None, Delivery: None, Apgar1: None, Apgar5: None, Living: None, Birth Comments: None    # 3 - Date: 2012, Sex: Female, Weight: 5 lb 9 oz (2.523 kg), GA: [redacted]w[redacted]d, Delivery: Vaginal, Spontaneous, Apgar1: None, Apgar5: None, Living: Living, Birth Comments: None    # 4 - Date: 30, Sex: None, Weight: 6 lb (2.722 kg), GA: [redacted]w[redacted]d, Delivery: Vaginal, Spontaneous, Apgar1: None, Apgar5: None, Living: Living, Birth Comments: None    # 5 - Date: 2017, Sex: None, Weight: 7 lb (3.175 kg), GA: [redacted]w[redacted]d, Delivery: Vaginal, Spontaneous, Apgar1: None, Apgar5: None, Living: Living, Birth Comments: None    # 6 - Date: None, Sex: None, Weight: None, GA: None, Delivery: None, Apgar1: None, Apgar5: None, Living: None, Birth Comments: None        Past Medical History:   Diagnosis Date   ??? Asthma    ??? History of placental abruption    ??? Pre-eclampsia in postpartum period    ??? Preeclampsia        Past Surgical History:   Procedure Laterality Date   ??? HX TONSILLECTOMY         Family History    Problem Relation Age of Onset   ??? Breast Cancer Paternal Aunt    ??? Ovarian Cancer Neg Hx    ??? Uterine Cancer Neg Hx    ??? Colon Cancer Neg Hx        Social History     Socioeconomic History   ??? Marital status: SINGLE     Spouse name: Not on file   ??? Number of children: Not on file   ??? Years of education: Not on file   ??? Highest education level: Not on file   Occupational History   ??? Not on file   Social Needs   ??? Financial resource strain: Not on file   ??? Food insecurity     Worry: Not on file     Inability: Not on file   ??? Transportation needs     Medical: Not on file     Non-medical: Not on file   Tobacco Use   ??? Smoking status: Never Smoker   ??? Smokeless tobacco: Never Used   Substance and Sexual Activity   ??? Alcohol use: Not Currently   ??? Drug use: Never   ???  Sexual activity: Yes   Lifestyle   ??? Physical activity     Days per week: Not on file     Minutes per session: Not on file   ??? Stress: Not on file   Relationships   ??? Social Wellsite geologistconnections     Talks on phone: Not on file     Gets together: Not on file     Attends religious service: Not on file     Active member of club or organization: Not on file     Attends meetings of clubs or organizations: Not on file     Relationship status: Not on file   ??? Intimate partner violence     Fear of current or ex partner: Not on file     Emotionally abused: Not on file     Physically abused: Not on file     Forced sexual activity: Not on file   Other Topics Concern   ??? Military Service Not Asked   ??? Blood Transfusions Not Asked   ??? Caffeine Concern No   ??? Occupational Exposure Not Asked   ??? Hobby Hazards Not Asked   ??? Sleep Concern Not Asked   ??? Stress Concern Not Asked   ??? Weight Concern Not Asked   ??? Special Diet Not Asked   ??? Back Care Not Asked   ??? Exercise No   ??? Bike Helmet Not Asked   ??? Seat Belt Yes   ??? Self-Exams Yes   Social History Narrative    Abuse: Feels safe at home, no history of physical abuse, no history of sexual abuse           Objective    Visit Vitals   BP 122/80 (BP 1 Location: Left arm, BP Patient Position: Sitting)   Temp 98.4 ??F (36.9 ??C) (Temporal)   Wt 175 lb (79.4 kg)       General: well developed, well nourished, in no acute distress    Head: normocephalic and atraumatic    Resp: even and unlabored    Psych: Normal mood and affect        Assessment and Plan      Problem List  Never Reviewed          Codes Class    Multigravida in second trimester ICD-10-CM: Z34.82  ICD-9-CM: V22.1     Overview Signed 09/09/2018  3:23 PM by Vernell Barrieraylor, Creedence Kunesh L, NP     EDD 02/23/2019 by LMP c/w 16wk US             Asthma during pregnancy ICD-10-CM: O99.519, J45.909  ICD-9-CM: 648.93, 493.90     Overview Signed 09/09/2018  3:22 PM by Vernell Barrieraylor, Cosimo Schertzer L, NP     Rare inhaler use (1-2/year)             History of pre-eclampsia in prior pregnancy, currently pregnant in second trimester ICD-10-CM: O09.292  ICD-9-CM: V23.49     Overview Addendum 09/09/2018  3:27 PM by Vernell Barrieraylor, Le Faulcon L, NP     Hx of preeclampsia (requiring delivery at 36 weeks) and a Hx of postpartum preeclampsia  Baseline pre-e labs/urine, Start ASA 162 mg daily             History of placenta abruption ICD-10-CM: Z87.59  ICD-9-CM: V13.29               Problem List Items Addressed This Visit        Respiratory    Asthma during pregnancy  Relevant Medications    albuterol (PROVENTIL HFA, VENTOLIN HFA, PROAIR HFA) 90 mcg/actuation inhaler       Other    History of pre-eclampsia in prior pregnancy, currently pregnant in second trimester    Multigravida in second trimester     History reviewed  PNV's ordered (if not already taking)  PN labs, UDS ordered  Genetic testing - CF, SMA, tetra today   D/W pt at length genetic testing that is recommended by ACOG -- NIPT, Quad screen (for trisomies and NTD), CF, SMA.  Brief discussion of these diseases - conditions that may increase risks, etiology, carrier states, fetal effects, treatment options, etc was undertaken. D/W pt that these are screening tests/carrier screening test only and are NOT mandatory. We also discuss false POS/false neg rates. It is her decision whether to have them done and how to proceed with the information afterwards.  All questions answered, pt understood and wishes to proceed with indicated tests.      Problem list reviewed  OB physical completed  OB prenatal packet/education reviewed  RTO 4 weeks with ANA US  PTL/labor precautions, FMC, and pregnancy warning signs reviewed.             Relevant Orders    ANTIBODY SCREEN    BLOOD TYPE, (ABO+RH)    CBC W/O DIFF    DRUG ABUSE PROF, URINE (SEVEN DRUGS), MS COFIRM    CHLAMYDIA/GC PCR    HEMOGLOBIN A1C WITH EAG    HEMOGLOBIN FRACTIONATION    HEP B SURFACE AG    HIV 1/2 AG/AB, 4TH GENERATION,W RFLX CONFIRM    HEPATITIS C AB    RPR W/REFLEX TITER AND TREPONEMA ABS    RUBELLA AB, IGG    METABOLIC PANEL, COMPREHENSIVE    LD    URIC ACID    PROTEIN, URINE, 24 HR    AFP TETRA SCREEN, OBSTETRICAL    CYSTIC FIBROSIS MUTATION 97    SMN1 COPY NUMBER ANALYSIS      Other Visit Diagnoses     Late prenatal care affecting pregnancy in second trimester    -  Primary    Relevant Orders    AMB POC US OB >= 14 WKS, 1ST GESTATION (Completed)    Encounter to determine fetal viability of pregnancy, single or unspecified fetus        Relevant Orders    AMB POC US OB >= 14 WKS, 1ST GESTATION (Completed)          Orders Placed This Encounter   ??? GC/Chlam   ??? Fetal & Maternal Eval 1st Gest, >14 Weeks   ??? CBC w/o Diff   ??? DRUG ABUSE PROF, URINE (SEVEN DRUGS), MS COFIRM   ??? HEMOGLOBIN A1C WITH EAG   ??? HEMOGLOBIN FRACTIONATION   ??? HEP B SURFACE AG   ??? HIV 1/2 AG/AB w/ Reflex Confirm   ??? HEPATITIS C AB    ??? RPR W/REFLEX TITER AND TREPONEMA ABS   ??? Rubella   ??? CMP   ??? LDH   ??? Uric Acid   ??? 24 Hr. Protein, Urine   ??? AFP Tetra   ??? Cystic Fibrosis   ??? Spinal Muscular Atrophy (SMA) Carrie   ??? Ab Screen   ??? ABO, RH   ??? albuterol (PROVENTIL HFA, VENTOLIN HFA, PROAIR HFA) 90 mcg/actuation inhaler   ??? PNV no.153/FA/om3/dha/epa/fish (PRENATAL GUMMIES PO)   ??? aspirin delayed-release 81 mg tablet       Outpatient Encounter Medications as of 09/09/2018  Medication Sig Dispense Refill   ??? albuterol (PROVENTIL HFA, VENTOLIN HFA, PROAIR HFA) 90 mcg/actuation inhaler Take  by inhalation.     ??? PNV no.153/FA/om3/dha/epa/fish (PRENATAL GUMMIES PO) Take  by mouth.     ??? aspirin delayed-release 81 mg tablet Take 2 Tabs by mouth daily. 60 Tab 11     No facility-administered encounter medications on file as of 09/09/2018.                Labor signs, pregnancy warning signs, and fetal movement counting reviewed (if applicable)        Marcelino Freestone, NP 09/09/18 3:27 PM

## 2018-09-09 NOTE — Progress Notes (Signed)
Patient comes in today for initial prenatal visit. No complaints/concerns today.    Fetal Movements:  No  Contractions:  No  Vaginal Bleeding:  No  Leaking Fluid:  No  GI/GU issues:  No    Drug/Alcohol 4P's Plus Screening    1.  Have either of your parents ever had a problem with drugs/alcohol/prescription drugs? yes  2.  Does your partner have a problem with drugs/alcohol/prescription drugs?  no  3.  In the past, have you ever had a problem with drugs/alcohol/prescription drugs?  no  4.  Before you were pregnant, in the past month, have you done any drugs, drank any alcohol or abused any prescription drugs?  no    If "YES" to any of the above, please give further details:  Patient mother had issue with drug use.    LAST PAP:  6 months ago [facility] on [address]    LAST MAMMO:  Never    LMP:  Patient's last menstrual period was 05/19/2018 (exact date).    BIRTH CONTROL:  none    FAMILY HISTORY OF:   Breast Cancer:  yes   Ovarian Cancer:  no   Uterine Cancer:  no   Colon Cancer:  no    Visit Vitals  BP 122/80 (BP 1 Location: Left arm, BP Patient Position: Sitting)   Temp 98.4 ??F (36.9 ??C) (Temporal)   Wt 175 lb (79.4 kg)       Amoreena Neubert L Clovia Reine  09/09/18  1:59 PM

## 2018-09-09 NOTE — Progress Notes (Signed)
 Patient comes in today for initial prenatal visit. No complaints/concerns today.    Fetal Movements:  No  Contractions:  No  Vaginal Bleeding:  No  Leaking Fluid:  No  GI/GU issues:  No    Drug/Alcohol 4P's Plus Screening    1.  Have either of your parents ever had a problem with drugs/alcohol/prescription drugs? yes  2.  Does your partner have a problem with drugs/alcohol/prescription drugs?  no  3.  In the past, have you ever had a problem with drugs/alcohol/prescription drugs?  no  4.  Before you were pregnant, in the past month, have you done any drugs, drank any alcohol or abused any prescription drugs?  no    If YES to any of the above, please give further details:  Patient mother had issue with drug use.    LAST PAP:  6 months ago [facility] on [address]    LAST MAMMO:  Never    LMP:  Patient's last menstrual period was 05/19/2018 (exact date).    BIRTH CONTROL:  none    FAMILY HISTORY OF:   Breast Cancer:  yes   Ovarian Cancer:  no   Uterine Cancer:  no   Colon Cancer:  no    Visit Vitals  BP 122/80 (BP 1 Location: Left arm, BP Patient Position: Sitting)   Temp 98.4 F (36.9 C) (Temporal)   Wt 175 lb (79.4 kg)       Debra Warren  09/09/18  1:59 PM

## 2018-09-09 NOTE — Assessment & Plan Note (Signed)
History reviewed  PNV's ordered (if not already taking)  PN labs, UDS ordered  Genetic testing - CF, SMA, tetra today  D/W pt at length genetic testing that is recommended by ACOG -- NIPT, Quad screen (for trisomies and NTD), CF, SMA.  Brief discussion of these diseases - conditions that may increase risks, etiology, carrier states, fetal effects, treatment options, etc was undertaken. D/W pt that these are screening tests/carrier screening test only and are NOT mandatory. We also discuss false POS/false neg rates. It is her decision whether to have them done and how to proceed with the information afterwards.  All questions answered, pt understood and wishes to proceed with indicated tests.      Problem list reviewed  OB physical completed  OB prenatal packet/education reviewed  RTO 4 weeks with ANA [place]  PTL/labor precautions, FMC, and pregnancy warning signs reviewed.

## 2018-09-09 NOTE — Progress Notes (Signed)
This is a 28 y.o.  G6 P2123 at 5678w1d for routine OB visit.    Her Estimated Due Date is 02/23/2019, by Last Menstrual Period    Denies leaking of fluid, vaginal bleeding, or regular contractions.Denies severe or persistent HA, vision changes, RUQ/epigastric pain, N/V or swelling.      Current Outpatient Medications on File Prior to Visit   Medication Sig Dispense Refill   ??? albuterol (PROVENTIL HFA, VENTOLIN HFA, PROAIR HFA) 90 mcg/actuation inhaler Take  by inhalation.     ??? PNV no.153/FA/om3/dha/epa/fish (PRENATAL GUMMIES PO) Take  by mouth.       No current facility-administered medications on file prior to visit.        No Known Allergies    OB History   Gravida Para Term Preterm AB Living   6 3 2 1 2 3    SAB TAB Ectopic Molar Multiple Live Births   0 0 0 0 0 3       # 1 - Date: None, Sex: None, Weight: None, GA: None, Delivery: None, Apgar1: None, Apgar5: None, Living: None, Birth Comments: None    # 2 - Date: None, Sex: None, Weight: None, GA: None, Delivery: None, Apgar1: None, Apgar5: None, Living: None, Birth Comments: None    # 3 - Date: 2012, Sex: Female, Weight: 5 lb 9 oz (2.523 kg), GA: 3142w0d, Delivery: Vaginal, Spontaneous, Apgar1: None, Apgar5: None, Living: Living, Birth Comments: None    # 4 - Date: 622015, Sex: None, Weight: 6 lb (2.722 kg), GA: 7595w0d, Delivery: Vaginal, Spontaneous, Apgar1: None, Apgar5: None, Living: Living, Birth Comments: None    # 5 - Date: 2017, Sex: None, Weight: 7 lb (3.175 kg), GA: 4852w0d, Delivery: Vaginal, Spontaneous, Apgar1: None, Apgar5: None, Living: Living, Birth Comments: None    # 6 - Date: None, Sex: None, Weight: None, GA: None, Delivery: None, Apgar1: None, Apgar5: None, Living: None, Birth Comments: None        Past Medical History:   Diagnosis Date   ??? Asthma    ??? History of placental abruption    ??? Pre-eclampsia in postpartum period    ??? Preeclampsia        Past Surgical History:   Procedure Laterality Date   ??? HX TONSILLECTOMY         Family History   Problem  Relation Age of Onset   ??? Breast Cancer Paternal Aunt    ??? Ovarian Cancer Neg Hx    ??? Uterine Cancer Neg Hx    ??? Colon Cancer Neg Hx        Social History     Socioeconomic History   ??? Marital status: SINGLE     Spouse name: Not on file   ??? Number of children: Not on file   ??? Years of education: Not on file   ??? Highest education level: Not on file   Occupational History   ??? Not on file   Social Needs   ??? Financial resource strain: Not on file   ??? Food insecurity     Worry: Not on file     Inability: Not on file   ??? Transportation needs     Medical: Not on file     Non-medical: Not on file   Tobacco Use   ??? Smoking status: Never Smoker   ??? Smokeless tobacco: Never Used   Substance and Sexual Activity   ??? Alcohol use: Not Currently   ??? Drug use: Never   ???  Sexual activity: Yes   Lifestyle   ??? Physical activity     Days per week: Not on file     Minutes per session: Not on file   ??? Stress: Not on file   Relationships   ??? Social Wellsite geologistconnections     Talks on phone: Not on file     Gets together: Not on file     Attends religious service: Not on file     Active member of club or organization: Not on file     Attends meetings of clubs or organizations: Not on file     Relationship status: Not on file   ??? Intimate partner violence     Fear of current or ex partner: Not on file     Emotionally abused: Not on file     Physically abused: Not on file     Forced sexual activity: Not on file   Other Topics Concern   ??? Military Service Not Asked   ??? Blood Transfusions Not Asked   ??? Caffeine Concern No   ??? Occupational Exposure Not Asked   ??? Hobby Hazards Not Asked   ??? Sleep Concern Not Asked   ??? Stress Concern Not Asked   ??? Weight Concern Not Asked   ??? Special Diet Not Asked   ??? Back Care Not Asked   ??? Exercise No   ??? Bike Helmet Not Asked   ??? Seat Belt Yes   ??? Self-Exams Yes   Social History Narrative    Abuse: Feels safe at home, no history of physical abuse, no history of sexual abuse           Objective    Visit Vitals  BP 122/80  (BP 1 Location: Left arm, BP Patient Position: Sitting)   Temp 98.4 ??F (36.9 ??C) (Temporal)   Wt 175 lb (79.4 kg)       General: well developed, well nourished, in no acute distress    Head: normocephalic and atraumatic    Resp: even and unlabored    Psych: Normal mood and affect        Assessment and Plan      Problem List  Never Reviewed          Codes Class    Multigravida in second trimester ICD-10-CM: Z34.82  ICD-9-CM: V22.1     Overview Signed 09/09/2018  3:23 PM by Vernell Barrieraylor, Eldean Nanna L, NP     EDD 02/23/2019 by LMP c/w 16wk US             Asthma during pregnancy ICD-10-CM: O99.519, J45.909  ICD-9-CM: 648.93, 493.90     Overview Signed 09/09/2018  3:22 PM by Vernell Barrieraylor, Kaybree Williams L, NP     Rare inhaler use (1-2/year)             History of pre-eclampsia in prior pregnancy, currently pregnant in second trimester ICD-10-CM: O09.292  ICD-9-CM: V23.49     Overview Addendum 09/09/2018  3:27 PM by Vernell Barrieraylor, Miyonna Ormiston L, NP     Hx of preeclampsia (requiring delivery at 36 weeks) and a Hx of postpartum preeclampsia  Baseline pre-e labs/urine, Start ASA 162 mg daily             History of placenta abruption ICD-10-CM: Z87.59  ICD-9-CM: V13.29               Problem List Items Addressed This Visit        Respiratory    Asthma during pregnancy  Relevant Medications    albuterol (PROVENTIL HFA, VENTOLIN HFA, PROAIR HFA) 90 mcg/actuation inhaler       Other    History of pre-eclampsia in prior pregnancy, currently pregnant in second trimester    Multigravida in second trimester     History reviewed  PNV's ordered (if not already taking)  PN labs, UDS ordered  Genetic testing - CF, SMA, tetra today  D/W pt at length genetic testing that is recommended by ACOG -- NIPT, Quad screen (for trisomies and NTD), CF, SMA.  Brief discussion of these diseases - conditions that may increase risks, etiology, carrier states, fetal effects, treatment options, etc was undertaken. D/W pt that these are screening tests/carrier screening test only and are NOT mandatory.  We also discuss false POS/false neg rates. It is her decision whether to have them done and how to proceed with the information afterwards.  All questions answered, pt understood and wishes to proceed with indicated tests.      Problem list reviewed  OB physical completed  OB prenatal packet/education reviewed  RTO 4 weeks with ANA US  PTL/labor precautions, FMC, and pregnancy warning signs reviewed.             Relevant Orders    ANTIBODY SCREEN    BLOOD TYPE, (ABO+RH)    CBC W/O DIFF    DRUG ABUSE PROF, URINE (SEVEN DRUGS), MS COFIRM    CHLAMYDIA/GC PCR    HEMOGLOBIN A1C WITH EAG    HEMOGLOBIN FRACTIONATION    HEP B SURFACE AG    HIV 1/2 AG/AB, 4TH GENERATION,W RFLX CONFIRM    HEPATITIS C AB    RPR W/REFLEX TITER AND TREPONEMA ABS    RUBELLA AB, IGG    METABOLIC PANEL, COMPREHENSIVE    LD    URIC ACID    PROTEIN, URINE, 24 HR    AFP TETRA SCREEN, OBSTETRICAL    CYSTIC FIBROSIS MUTATION 97    SMN1 COPY NUMBER ANALYSIS      Other Visit Diagnoses     Late prenatal care affecting pregnancy in second trimester    -  Primary    Relevant Orders    AMB POC US OB >= 14 WKS, 1ST GESTATION (Completed)    Encounter to determine fetal viability of pregnancy, single or unspecified fetus        Relevant Orders    AMB POC US OB >= 14 WKS, 1ST GESTATION (Completed)          Orders Placed This Encounter   ??? GC/Chlam   ??? Fetal & Maternal Eval 1st Gest, >14 Weeks   ??? CBC w/o Diff   ??? DRUG ABUSE PROF, URINE (SEVEN DRUGS), MS COFIRM   ??? HEMOGLOBIN A1C WITH EAG   ??? HEMOGLOBIN FRACTIONATION   ??? HEP B SURFACE AG   ??? HIV 1/2 AG/AB w/ Reflex Confirm   ??? HEPATITIS C AB   ??? RPR W/REFLEX TITER AND TREPONEMA ABS   ??? Rubella   ??? CMP   ??? LDH   ??? Uric Acid   ??? 24 Hr. Protein, Urine   ??? AFP Tetra   ??? Cystic Fibrosis   ??? Spinal Muscular Atrophy (SMA) Carrie   ??? Ab Screen   ??? ABO, RH   ??? albuterol (PROVENTIL HFA, VENTOLIN HFA, PROAIR HFA) 90 mcg/actuation inhaler   ??? PNV no.153/FA/om3/dha/epa/fish (PRENATAL GUMMIES PO)   ??? aspirin delayed-release 81  mg tablet       Outpatient Encounter Medications as of 09/09/2018  Medication Sig Dispense Refill   ??? albuterol (PROVENTIL HFA, VENTOLIN HFA, PROAIR HFA) 90 mcg/actuation inhaler Take  by inhalation.     ??? PNV no.153/FA/om3/dha/epa/fish (PRENATAL GUMMIES PO) Take  by mouth.     ??? aspirin delayed-release 81 mg tablet Take 2 Tabs by mouth daily. 60 Tab 11     No facility-administered encounter medications on file as of 09/09/2018.                Labor signs, pregnancy warning signs, and fetal movement counting reviewed (if applicable)        Marcelino Freestone, NP 09/09/18 3:27 PM

## 2018-09-10 ENCOUNTER — Other Ambulatory Visit: Admit: 2018-09-10 | Discharge: 2018-09-10 | Payer: PRIVATE HEALTH INSURANCE

## 2018-09-10 DIAGNOSIS — Z3481 Encounter for supervision of other normal pregnancy, first trimester: Secondary | ICD-10-CM

## 2018-09-11 ENCOUNTER — Encounter: Admit: 2018-09-11 | Discharge: 2018-09-11 | Payer: PRIVATE HEALTH INSURANCE

## 2018-09-12 LAB — RUBELLA AB, IGG
RUBELLA ANTIBODIES, IGG, 006201: 2.93 index (ref >0.99)
Rubella Ab, IgG: 2.93 index (ref >0.99)

## 2018-09-12 LAB — HIV 1/2 ANTIGEN/ANTIBODY, FOURTH GENERATION W/RFL: HIV Screen 4th Generation wRfx: NONREACTIVE

## 2018-09-12 LAB — HEMOGLOBIN FRACTIONATION
HEMOGLOBIN A2: 2.7 % (ref 1.8–3.2)
HEMOGLOBIN F: 0 % (ref 0.0–2.0)
HEMOGLOBIN OTHER: 0 %
HEMOGLOBIN S: 0 %
HGB A: 97.3 % (ref 96.4–98.8)
HGB SOLUBILITY: NEGATIVE
Hemoglobin A2: 2.7 % (ref 1.8–3.2)
Hemoglobin A: 97.3 % (ref 96.4–98.8)
Hemoglobin C: 0 %
Hemoglobin C: 0 %
Hemoglobin F: 0 % (ref 0.0–2.0)
Hemoglobin S: 0 %
Hemoglobin, Other: 0 %
Hgb Solubility: NEGATIVE

## 2018-09-12 LAB — PROTEIN, URINE, 24 HR
PROTEIN TOTAL, URINE: 12.8 mg/dL
PROTEIN, URINE 24 HR, 013219: 128 mg/24 hr (ref 30–150)
Protein total, urine: 12.8 mg/dL
Protein urine, 24 hr calc.: 128 mg/24 hr (ref 30–150)

## 2018-09-12 LAB — COMPREHENSIVE METABOLIC PANEL
ALT: 9 IU/L (ref 0–32)
AST: 13 IU/L (ref 0–40)
Albumin/Globulin Ratio: 1.9 NA (ref 1.2–2.2)
Albumin: 4 g/dL (ref 3.9–5.0)
Alkaline Phosphatase: 50 IU/L (ref 39–117)
BUN: 6 mg/dL (ref 6–20)
Bun/Cre Ratio: 13 NA (ref 9–23)
CO2: 20 mmol/L (ref 20–29)
Calcium: 9.2 mg/dL (ref 8.7–10.2)
Chloride: 104 mmol/L (ref 96–106)
Creatinine: 0.46 mg/dL — ABNORMAL LOW (ref 0.57–1.00)
EGFR IF NonAfrican American: 136 mL/min/{1.73_m2} (ref >59)
GFR African American: 157 mL/min/{1.73_m2} (ref >59)
Globulin, Total: 2.1 g/dL (ref 1.5–4.5)
Glucose: 86 mg/dL (ref 65–99)
Potassium: 3.7 mmol/L (ref 3.5–5.2)
Sodium: 137 mmol/L (ref 134–144)
Total Bilirubin: 0.6 mg/dL (ref 0.0–1.2)
Total Protein: 6.1 g/dL (ref 6.0–8.5)

## 2018-09-12 LAB — LACTATE DEHYDROGENASE: LD: 130 IU/L (ref 119–226)

## 2018-09-12 LAB — CBC
Hematocrit: 34.5 % (ref 34.0–46.6)
Hemoglobin: 12 g/dL (ref 11.1–15.9)
MCH: 28.6 pg (ref 26.6–33.0)
MCHC: 34.8 g/dL (ref 31.5–35.7)
MCV: 82 fL (ref 79–97)
Platelets: 216 10*3/uL (ref 150–450)
RBC: 4.2 x10E6/uL (ref 3.77–5.28)
RDW: 12.9 % (ref 11.7–15.4)
WBC: 6.9 10*3/uL (ref 3.4–10.8)

## 2018-09-12 LAB — HEPATITIS B SURFACE ANTIGEN: Hepatitis B Surface Ag: NEGATIVE

## 2018-09-12 LAB — RPR W/REFLEX TITER AND TREPONEMA ABS
RPR: NONREACTIVE
RPR: NONREACTIVE

## 2018-09-12 LAB — HEPATITIS C ANTIBODY: HCV Ab: <0.1 s/co ratio (ref 0.0–0.9)

## 2018-09-12 LAB — BLOOD TYPE, (ABO+RH)
Rh (D): POSITIVE
Rh Type: POSITIVE

## 2018-09-12 LAB — HEMOGLOBIN A1C W/EAG
Hemoglobin A1C: 5.2 % (ref 4.8–5.6)
eAG: 103 mg/dL

## 2018-09-12 LAB — ANTIBODY SCREEN
Antibody Screen: NEGATIVE
Antibody screen: NEGATIVE

## 2018-09-12 LAB — URIC ACID
Uric Acid, Serum: 3.9 mg/dL (ref 2.5–7.1)
Uric acid: 3.9 mg/dL (ref 2.5–7.1)

## 2018-09-12 LAB — HEP B SURFACE AG: Hep B surface Ag screen: NEGATIVE

## 2018-09-12 LAB — CBC W/O DIFF
HCT: 34.5 % (ref 34.0–46.6)
HGB: 12 g/dL (ref 11.1–15.9)
MCH: 28.6 pg (ref 26.6–33.0)
MCHC: 34.8 g/dL (ref 31.5–35.7)
MCV: 82 fL (ref 79–97)
PLATELET: 216 10*3/uL (ref 150–450)
RBC: 4.2 x10E6/uL (ref 3.77–5.28)
RDW: 12.9 % (ref 11.7–15.4)
WBC: 6.9 10*3/uL (ref 3.4–10.8)

## 2018-09-12 LAB — METABOLIC PANEL, COMPREHENSIVE
A-G Ratio: 1.9 (ref 1.2–2.2)
ALT (SGPT): 9 IU/L (ref 0–32)
AST (SGOT): 13 IU/L (ref 0–40)
Albumin: 4 g/dL (ref 3.9–5.0)
Alk. phosphatase: 50 IU/L (ref 39–117)
BUN/Creatinine ratio: 13 (ref 9–23)
BUN: 6 mg/dL (ref 6–20)
Bilirubin, total: 0.6 mg/dL (ref 0.0–1.2)
CO2: 20 mmol/L (ref 20–29)
Calcium: 9.2 mg/dL (ref 8.7–10.2)
Chloride: 104 mmol/L (ref 96–106)
Creatinine: 0.46 mg/dL — ABNORMAL LOW (ref 0.57–1.00)
GFR est AA: 157 mL/min/{1.73_m2} (ref >59)
GFR est non-AA: 136 mL/min/{1.73_m2} (ref >59)
GLOBULIN, TOTAL: 2.1 g/dL (ref 1.5–4.5)
Glucose: 86 mg/dL (ref 65–99)
Potassium: 3.7 mmol/L (ref 3.5–5.2)
Protein, total: 6.1 g/dL (ref 6.0–8.5)
Sodium: 137 mmol/L (ref 134–144)

## 2018-09-12 LAB — LD: LD: 130 IU/L (ref 119–226)

## 2018-09-12 LAB — HEMOGLOBIN A1C WITH EAG
Estimated average glucose: 103 mg/dL
Hemoglobin A1c: 5.2 % (ref 4.8–5.6)

## 2018-09-12 LAB — HIV 1/2 AG/AB, 4TH GENERATION,W RFLX CONFIRM: HIV SCREEN 4TH GENERATION WRFX: NONREACTIVE

## 2018-09-12 LAB — HEPATITIS C AB: Hep C Virus Ab: <0.1 s/co ratio (ref 0.0–0.9)

## 2018-09-16 LAB — CHLAMYDIA/GC PCR
CHLAMYDIA TRACHOMATIS, NAA, 188078: NEGATIVE
Chlamydia trachomatis, NAA: NEGATIVE
NEISSERIA GONORRHOEAE, NAA, 188086: NEGATIVE
Neisseria gonorrhoeae, NAA: NEGATIVE

## 2018-09-16 LAB — DRUG ABUSE PROF, URINE (SEVEN DRUGS), MS COFIRM
Amphetamines, urine: NEGATIVE ng/mL
Barbiturates: NEGATIVE ng/mL
Benzodiazepines: NEGATIVE ng/mL
Cannabinoids: NEGATIVE ng/mL
Cocaine: NEGATIVE ng/mL
Opiates: NEGATIVE ng/mL
Phencyclidine: NEGATIVE ng/mL

## 2018-09-24 LAB — AFP TETRA SCREEN, OBSTETRICAL
AFP MOM, 017376: 1.19 NA
AFP MoM: 1.19
AFP VALUE, 017375: 39.5 ng/mL
AFP Value: 39.5 ng/mL
DIA MOM, 017382: 0.58 NA
DIA MoM: 0.58
DIA VALUE, 017381: 88.58 pg/mL
DIA Value: 88.58 pg/mL
DSR (2nd Trimester) - 1 in: 10000
DSR (by age) - 1 in: 749
DSR 2ND TRIMESTER  1 IN, 017384: 10000 NA
DSR BY AGE  1 IN, 017385: 749 NA
Gest. Age on Collection Date: 16.2 WEEKS
Gest. Age on Collection Date: 16.2 WEEKS
HCG MOM, 017378: 0.86 NA
HCG VALUE, 017377: 31672 m[IU]/mL
MATERNAL AGE AT EDD, 017370: 29.4 yr
Maternal Age At EDD: 29.4 yr
OSBR Risk  1 In: 10000 NA
OSBR Risk - 1 in: 10000
T18 (by age): 1:2917 {titer}
T18 BY AGE, 017391: 1:2917 {titer}
TEST RESULTS, 017320: NEGATIVE
Test results: NEGATIVE
UE3 MOM, 017380: 0.92 NA
UE3 VALUE, 017379: 0.82 ng/mL
WEIGHT, 017372: 175 [lb_av]
Weight: 175 [lb_av]
hCG MoM: 0.86
hCG Value: 31672 m[IU]/mL
uE3 MoM: 0.92
uE3 Value: 0.82 ng/mL

## 2018-09-24 LAB — CYSTIC FIBROSIS MUTATION 97
INTERPRETATION, 450021: NOT DETECTED
Interpretation: NOT DETECTED

## 2018-09-24 LAB — SMN1 COPY NUMBER ANALYSIS

## 2018-10-07 ENCOUNTER — Ambulatory Visit: Attending: Women's Health

## 2018-10-07 ENCOUNTER — Ambulatory Visit: Admit: 2018-10-07 | Discharge: 2018-10-07 | Payer: PRIVATE HEALTH INSURANCE | Attending: Women's Health

## 2018-10-07 DIAGNOSIS — O09292 Supervision of pregnancy with other poor reproductive or obstetric history, second trimester: Secondary | ICD-10-CM

## 2018-10-07 NOTE — Progress Notes (Signed)
Patient comes in today for routine prenatal visit. No complaints/concerns today.    Fetal Movements:  Yes  Contractions:  No  Vaginal Bleeding:  No  Leaking Fluid:  No  GI/GU issues:  No    Visit Vitals  BP 110/60 (BP 1 Location: Left arm, BP Patient Position: Sitting)   Temp 98.1 ??F (36.7 ??C) (Temporal)   Ht 5' (1.524 m)   Wt 175 lb (79.4 kg)   Breastfeeding No   BMI 34.18 kg/m??         Debra Warren  10/07/18  9:12 AM

## 2018-10-07 NOTE — Progress Notes (Signed)
This is a 28 y.o.  G6 P2123 at 6428w1d for routine OB visit.    Her Estimated Due Date is 02/23/2019, by Last Menstrual Period    Denies leaking of fluid, vaginal bleeding, or regular contractions. Reports fetal movement. Denies severe or persistent HA, vision changes, RUQ/epigastric pain, N/V or swelling.      Current Outpatient Medications on File Prior to Visit   Medication Sig Dispense Refill   ??? albuterol (PROVENTIL HFA, VENTOLIN HFA, PROAIR HFA) 90 mcg/actuation inhaler Take  by inhalation.     ??? PNV no.153/FA/om3/dha/epa/fish (PRENATAL GUMMIES PO) Take  by mouth.     ??? aspirin delayed-release 81 mg tablet Take 2 Tabs by mouth daily. 60 Tab 11     No current facility-administered medications on file prior to visit.        No Known Allergies    OB History   Gravida Para Term Preterm AB Living   6 3 2 1 2 3    SAB TAB Ectopic Molar Multiple Live Births   0 0 0 0 0 3       # 1 - Date: None, Sex: None, Weight: None, GA: None, Delivery: None, Apgar1: None, Apgar5: None, Living: None, Birth Comments: None    # 2 - Date: None, Sex: None, Weight: None, GA: None, Delivery: None, Apgar1: None, Apgar5: None, Living: None, Birth Comments: None    # 3 - Date: 2012, Sex: Female, Weight: 5 lb 9 oz (2.523 kg), GA: 7245w0d, Delivery: Vaginal, Spontaneous, Apgar1: None, Apgar5: None, Living: Living, Birth Comments: None    # 4 - Date: 602015, Sex: None, Weight: 6 lb (2.722 kg), GA: 6077w0d, Delivery: Vaginal, Spontaneous, Apgar1: None, Apgar5: None, Living: Living, Birth Comments: None    # 5 - Date: 2017, Sex: None, Weight: 7 lb (3.175 kg), GA: 1524w0d, Delivery: Vaginal, Spontaneous, Apgar1: None, Apgar5: None, Living: Living, Birth Comments: None    # 6 - Date: None, Sex: None, Weight: None, GA: None, Delivery: None, Apgar1: None, Apgar5: None, Living: None, Birth Comments: None        Past Medical History:   Diagnosis Date   ??? Asthma    ??? History of placental abruption    ??? Pre-eclampsia in postpartum period    ??? Preeclampsia     ??? Umbilical hernia        Past Surgical History:   Procedure Laterality Date   ??? HX TONSILLECTOMY         Family History   Problem Relation Age of Onset   ??? Breast Cancer Paternal Aunt    ??? Ovarian Cancer Neg Hx    ??? Uterine Cancer Neg Hx    ??? Colon Cancer Neg Hx        Social History     Socioeconomic History   ??? Marital status: SINGLE     Spouse name: Not on file   ??? Number of children: Not on file   ??? Years of education: Not on file   ??? Highest education level: Not on file   Occupational History   ??? Not on file   Social Needs   ??? Financial resource strain: Not on file   ??? Food insecurity     Worry: Not on file     Inability: Not on file   ??? Transportation needs     Medical: Not on file     Non-medical: Not on file   Tobacco Use   ??? Smoking status: Never Smoker   ???  Smokeless tobacco: Never Used   Substance and Sexual Activity   ??? Alcohol use: Not Currently   ??? Drug use: Never   ??? Sexual activity: Yes   Lifestyle   ??? Physical activity     Days per week: Not on file     Minutes per session: Not on file   ??? Stress: Not on file   Relationships   ??? Social Product manager on phone: Not on file     Gets together: Not on file     Attends religious service: Not on file     Active member of club or organization: Not on file     Attends meetings of clubs or organizations: Not on file     Relationship status: Not on file   ??? Intimate partner violence     Fear of current or ex partner: Not on file     Emotionally abused: Not on file     Physically abused: Not on file     Forced sexual activity: Not on file   Other Topics Concern   ??? Military Service Not Asked   ??? Blood Transfusions Not Asked   ??? Caffeine Concern No   ??? Occupational Exposure Not Asked   ??? Hobby Hazards Not Asked   ??? Sleep Concern Not Asked   ??? Stress Concern Not Asked   ??? Weight Concern Not Asked   ??? Special Diet Not Asked   ??? Back Care Not Asked   ??? Exercise No   ??? Bike Helmet Not Asked   ??? Seat Belt Yes   ??? Self-Exams Yes   Social History Narrative     Abuse: Feels safe at home, no history of physical abuse, no history of sexual abuse           Objective    Visit Vitals  BP 110/60 (BP 1 Location: Left arm, BP Patient Position: Sitting)   Temp 98.1 ??F (36.7 ??C) (Temporal)   Ht 5' (1.524 m)   Wt 175 lb (79.4 kg)   Breastfeeding No   BMI 34.18 kg/m??       General: well developed, well nourished, in no acute distress    Head: normocephalic and atraumatic    Resp: even and unlabored    Psych: Normal mood and affect        Assessment and Plan      Problem List  Never Reviewed          Codes Class    Multigravida in second trimester ICD-10-CM: Z34.82  ICD-9-CM: V22.1     Overview Addendum 09/24/2018  7:51 AM by Marcelino Freestone, NP     EDD 02/23/2019 by LMP c/w 16wk [place]    09/10/2018: AFP, CF, SMA negative             Asthma during pregnancy ICD-10-CM: O99.519, J45.909  ICD-9-CM: 648.93, 493.90     Overview Signed 09/09/2018  3:22 PM by Marcelino Freestone, NP     Rare inhaler use (1-2/year)             History of pre-eclampsia in prior pregnancy, currently pregnant in second trimester ICD-10-CM: O09.292  ICD-9-CM: V23.49     Overview Addendum 09/13/2018  7:32 AM by Marcelino Freestone, NP     Hx of preeclampsia (requiring delivery at 36 weeks) and a Hx of postpartum preeclampsia  Baseline pre-e labs/urine, Start ASA 162 mg daily    09/11/2018: 24 hr urine protein 158  History of placenta abruption ICD-10-CM: Z87.59  ICD-9-CM: V13.29               Problem List Items Addressed This Visit        Respiratory    Asthma during pregnancy     noted            Other    History of pre-eclampsia in prior pregnancy, currently pregnant in second trimester - Primary     noted         Relevant Orders    AMB POC US OB >= 14 WKS, 1ST GESTATION (Completed)    Multigravida in second trimester     PTL/labor precautions, FMC, and pregnancy warning signs reviewed.  RTO 4 weeks         Relevant Orders    AMB POC US OB >= 14 WKS, 1ST GESTATION (Completed)      Other Visit Diagnoses      Encounter for fetal anatomic survey        Relevant Orders    AMB POC US OB >= 14 WKS, 1ST GESTATION (Completed)          Orders Placed This Encounter   ??? Fetal & Maternal Eval 1st Gest, >14 Weeks       Outpatient Encounter Medications as of 10/07/2018   Medication Sig Dispense Refill   ??? albuterol (PROVENTIL HFA, VENTOLIN HFA, PROAIR HFA) 90 mcg/actuation inhaler Take  by inhalation.     ??? PNV no.153/FA/om3/dha/epa/fish (PRENATAL GUMMIES PO) Take  by mouth.     ??? aspirin delayed-release 81 mg tablet Take 2 Tabs by mouth daily. 60 Tab 11     No facility-administered encounter medications on file as of 10/07/2018.                Labor signs, pregnancy warning signs, and fetal movement counting reviewed (if applicable)        Vernell Barrier, NP 10/07/18 9:14 AM

## 2018-10-07 NOTE — Assessment & Plan Note (Signed)
noted 

## 2018-10-07 NOTE — Assessment & Plan Note (Signed)
PTL/labor precautions, FMC, and pregnancy warning signs reviewed.  RTO 4 weeks

## 2018-10-07 NOTE — Patient Instructions (Signed)
Thank you for coming.  Return to the office in 4 weeks.  Please call if you have any abdominal pain and 5 contractions in 1 hour, vaginal bleeding or spotting, or leaking of fluid.

## 2018-10-07 NOTE — Progress Notes (Signed)
I have reviewed the patient's visit today including history, exam and assessment by Tyra Taylor, WHNP-BC.  I agree with treatment/plan as above.    Debra Warren Robert Kathy Wares, MD  9:32 AM  10/07/18

## 2018-10-07 NOTE — Progress Notes (Signed)
I have reviewed the patient's visit today including history, exam and assessment by Memory Argue, WHNP-BC.  I agree with treatment/plan as above.    Angela Burke, MD  9:32 AM  10/07/18

## 2018-10-07 NOTE — Progress Notes (Signed)
Patient comes in today for routine prenatal visit. No complaints/concerns today.    Fetal Movements:  Yes  Contractions:  No  Vaginal Bleeding:  No  Leaking Fluid:  No  GI/GU issues:  No    Visit Vitals  BP 110/60 (BP 1 Location: Left arm, BP Patient Position: Sitting)   Temp 98.1 F (36.7 C) (Temporal)   Ht 5' (1.524 m)   Wt 175 lb (79.4 kg)   Breastfeeding No   BMI 34.18 kg/m         Debra Warren  10/07/18  9:12 AM

## 2018-10-07 NOTE — Progress Notes (Signed)
This is a 28 y.o.  G6 P2123 at 3236w1d for routine OB visit.    Her Estimated Due Date is 02/23/2019, by Last Menstrual Period    Denies leaking of fluid, vaginal bleeding, or regular contractions. Reports fetal movement. Denies severe or persistent HA, vision changes, RUQ/epigastric pain, N/V or swelling.      Current Outpatient Medications on File Prior to Visit   Medication Sig Dispense Refill   ??? albuterol (PROVENTIL HFA, VENTOLIN HFA, PROAIR HFA) 90 mcg/actuation inhaler Take  by inhalation.     ??? PNV no.153/FA/om3/dha/epa/fish (PRENATAL GUMMIES PO) Take  by mouth.     ??? aspirin delayed-release 81 mg tablet Take 2 Tabs by mouth daily. 60 Tab 11     No current facility-administered medications on file prior to visit.        No Known Allergies    OB History   Gravida Para Term Preterm AB Living   6 3 2 1 2 3    SAB TAB Ectopic Molar Multiple Live Births   0 0 0 0 0 3       # 1 - Date: None, Sex: None, Weight: None, GA: None, Delivery: None, Apgar1: None, Apgar5: None, Living: None, Birth Comments: None    # 2 - Date: None, Sex: None, Weight: None, GA: None, Delivery: None, Apgar1: None, Apgar5: None, Living: None, Birth Comments: None    # 3 - Date: 2012, Sex: Female, Weight: 5 lb 9 oz (2.523 kg), GA: 7448w0d, Delivery: Vaginal, Spontaneous, Apgar1: None, Apgar5: None, Living: Living, Birth Comments: None    # 4 - Date: 312015, Sex: None, Weight: 6 lb (2.722 kg), GA: 3023w0d, Delivery: Vaginal, Spontaneous, Apgar1: None, Apgar5: None, Living: Living, Birth Comments: None    # 5 - Date: 2017, Sex: None, Weight: 7 lb (3.175 kg), GA: 6394w0d, Delivery: Vaginal, Spontaneous, Apgar1: None, Apgar5: None, Living: Living, Birth Comments: None    # 6 - Date: None, Sex: None, Weight: None, GA: None, Delivery: None, Apgar1: None, Apgar5: None, Living: None, Birth Comments: None        Past Medical History:   Diagnosis Date   ??? Asthma    ??? History of placental abruption    ??? Pre-eclampsia in postpartum period    ??? Preeclampsia    ???  Umbilical hernia        Past Surgical History:   Procedure Laterality Date   ??? HX TONSILLECTOMY         Family History   Problem Relation Age of Onset   ??? Breast Cancer Paternal Aunt    ??? Ovarian Cancer Neg Hx    ??? Uterine Cancer Neg Hx    ??? Colon Cancer Neg Hx        Social History     Socioeconomic History   ??? Marital status: SINGLE     Spouse name: Not on file   ??? Number of children: Not on file   ??? Years of education: Not on file   ??? Highest education level: Not on file   Occupational History   ??? Not on file   Social Needs   ??? Financial resource strain: Not on file   ??? Food insecurity     Worry: Not on file     Inability: Not on file   ??? Transportation needs     Medical: Not on file     Non-medical: Not on file   Tobacco Use   ??? Smoking status: Never Smoker   ???  Smokeless tobacco: Never Used   Substance and Sexual Activity   ??? Alcohol use: Not Currently   ??? Drug use: Never   ??? Sexual activity: Yes   Lifestyle   ??? Physical activity     Days per week: Not on file     Minutes per session: Not on file   ??? Stress: Not on file   Relationships   ??? Social Product manager on phone: Not on file     Gets together: Not on file     Attends religious service: Not on file     Active member of club or organization: Not on file     Attends meetings of clubs or organizations: Not on file     Relationship status: Not on file   ??? Intimate partner violence     Fear of current or ex partner: Not on file     Emotionally abused: Not on file     Physically abused: Not on file     Forced sexual activity: Not on file   Other Topics Concern   ??? Military Service Not Asked   ??? Blood Transfusions Not Asked   ??? Caffeine Concern No   ??? Occupational Exposure Not Asked   ??? Hobby Hazards Not Asked   ??? Sleep Concern Not Asked   ??? Stress Concern Not Asked   ??? Weight Concern Not Asked   ??? Special Diet Not Asked   ??? Back Care Not Asked   ??? Exercise No   ??? Bike Helmet Not Asked   ??? Seat Belt Yes   ??? Self-Exams Yes   Social History Narrative     Abuse: Feels safe at home, no history of physical abuse, no history of sexual abuse           Objective    Visit Vitals  BP 110/60 (BP 1 Location: Left arm, BP Patient Position: Sitting)   Temp 98.1 ??F (36.7 ??C) (Temporal)   Ht 5' (1.524 m)   Wt 175 lb (79.4 kg)   Breastfeeding No   BMI 34.18 kg/m??       General: well developed, well nourished, in no acute distress    Head: normocephalic and atraumatic    Resp: even and unlabored    Psych: Normal mood and affect        Assessment and Plan      Problem List  Never Reviewed          Codes Class    Multigravida in second trimester ICD-10-CM: Z34.82  ICD-9-CM: V22.1     Overview Addendum 09/24/2018  7:51 AM by Marcelino Freestone, NP     EDD 02/23/2019 by LMP c/w 16wk [place]    09/10/2018: AFP, CF, SMA negative             Asthma during pregnancy ICD-10-CM: O99.519, J45.909  ICD-9-CM: 648.93, 493.90     Overview Signed 09/09/2018  3:22 PM by Marcelino Freestone, NP     Rare inhaler use (1-2/year)             History of pre-eclampsia in prior pregnancy, currently pregnant in second trimester ICD-10-CM: O09.292  ICD-9-CM: V23.49     Overview Addendum 09/13/2018  7:32 AM by Marcelino Freestone, NP     Hx of preeclampsia (requiring delivery at 36 weeks) and a Hx of postpartum preeclampsia  Baseline pre-e labs/urine, Start ASA 162 mg daily    09/11/2018: 24 hr urine protein 158  History of placenta abruption ICD-10-CM: Z87.59  ICD-9-CM: V13.29               Problem List Items Addressed This Visit        Respiratory    Asthma during pregnancy     noted            Other    History of pre-eclampsia in prior pregnancy, currently pregnant in second trimester - Primary     noted         Relevant Orders    AMB POC US OB >= 14 WKS, 1ST GESTATION (Completed)    Multigravida in second trimester     PTL/labor precautions, [place], and pregnancy warning signs reviewed.  RTO 4 weeks         Relevant Orders    AMB POC US OB >= 14 WKS, 1ST GESTATION (Completed)      Other Visit Diagnoses     Encounter for fetal  anatomic survey        Relevant Orders    AMB POC US OB >= 14 WKS, 1ST GESTATION (Completed)          Orders Placed This Encounter   ??? Fetal & Maternal Eval 1st Gest, >14 Weeks       Outpatient Encounter Medications as of 10/07/2018   Medication Sig Dispense Refill   ??? albuterol (PROVENTIL HFA, VENTOLIN HFA, PROAIR HFA) 90 mcg/actuation inhaler Take  by inhalation.     ??? PNV no.153/FA/om3/dha/epa/fish (PRENATAL GUMMIES PO) Take  by mouth.     ??? aspirin delayed-release 81 mg tablet Take 2 Tabs by mouth daily. 60 Tab 11     No facility-administered encounter medications on file as of 10/07/2018.                Labor signs, pregnancy warning signs, and fetal movement counting reviewed (if applicable)        Marcelino Freestone, NP 10/07/18 9:14 AM

## 2018-10-28 ENCOUNTER — Inpatient Hospital Stay
Admit: 2018-10-28 | Discharge: 2018-10-28 | Disposition: A | Discharge status: Home / Self Care | Payer: PRIVATE HEALTH INSURANCE | Attending: Emergency Medicine

## 2018-10-28 DIAGNOSIS — R5383 Other fatigue: Secondary | ICD-10-CM

## 2018-10-28 LAB — COMPREHENSIVE METABOLIC PANEL
ALT: 36 U/L (ref 12–65)
AST: 23 U/L (ref 15–37)
Albumin/Globulin Ratio: 0.8 — ABNORMAL LOW (ref 1.2–3.5)
Albumin: 3 g/dL — ABNORMAL LOW (ref 3.5–5.0)
Alkaline Phosphatase: 82 U/L (ref 50–136)
Anion Gap: 9 mmol/L (ref 7–16)
BUN: 4 MG/DL — ABNORMAL LOW (ref 6–23)
CO2: 24 mmol/L (ref 21–32)
Calcium: 9.1 MG/DL (ref 8.3–10.4)
Chloride: 106 mmol/L (ref 98–107)
Creatinine: 0.44 MG/DL — ABNORMAL LOW (ref 0.6–1.0)
EGFR IF NonAfrican American: >60 mL/min/{1.73_m2} (ref >60)
GFR African American: >60 mL/min/{1.73_m2} (ref >60)
Globulin: 3.8 g/dL — ABNORMAL HIGH (ref 2.3–3.5)
Glucose: 95 mg/dL (ref 65–100)
Potassium: 2.8 mmol/L — CL (ref 3.5–5.1)
Sodium: 139 mmol/L (ref 136–145)
Total Bilirubin: 0.6 MG/DL (ref 0.2–1.1)
Total Protein: 6.8 g/dL (ref 6.3–8.2)

## 2018-10-28 LAB — CBC WITH AUTO DIFFERENTIAL
Basophils %: 0 % (ref 0.0–2.0)
Basophils Absolute: 0 10*3/uL (ref 0.0–0.2)
Eosinophils %: 1 % (ref 0.5–7.8)
Eosinophils Absolute: 0.1 10*3/uL (ref 0.0–0.8)
Granulocyte Absolute Count: 0.1 10*3/uL (ref 0.0–0.5)
Hematocrit: 35.6 % — ABNORMAL LOW (ref 35.8–46.3)
Hemoglobin: 12 g/dL (ref 11.7–15.4)
Immature Granulocytes: 1 % (ref 0.0–5.0)
Lymphocytes %: 13 % (ref 13–44)
Lymphocytes Absolute: 1.3 10*3/uL (ref 0.5–4.6)
MCH: 28.4 PG (ref 26.1–32.9)
MCHC: 33.7 g/dL (ref 31.4–35.0)
MCV: 84.2 FL (ref 79.6–97.8)
MPV: 10.8 FL (ref 9.4–12.3)
Monocytes %: 8 % (ref 4.0–12.0)
Monocytes Absolute: 0.9 10*3/uL (ref 0.1–1.3)
NRBC Absolute: 0 10*3/uL (ref 0.0–0.2)
Neutrophils %: 77 % (ref 43–78)
Neutrophils Absolute: 8 10*3/uL (ref 1.7–8.2)
Platelets: 241 10*3/uL (ref 150–450)
RBC: 4.23 M/uL (ref 4.05–5.2)
RDW: 12.9 % (ref 11.9–14.6)
WBC: 10.4 10*3/uL (ref 4.3–11.1)

## 2018-10-28 LAB — TROPONIN, HIGH SENSITIVITY: Troponin, High Sensitivity: <3 pg/mL (ref 0–14)

## 2018-10-28 LAB — CBC WITH AUTOMATED DIFF
ABS. BASOPHILS: 0 10*3/uL (ref 0.0–0.2)
ABS. EOSINOPHILS: 0.1 10*3/uL (ref 0.0–0.8)
ABS. IMM. GRANS.: 0.1 10*3/uL (ref 0.0–0.5)
ABS. LYMPHOCYTES: 1.3 10*3/uL (ref 0.5–4.6)
ABS. MONOCYTES: 0.9 10*3/uL (ref 0.1–1.3)
ABS. NEUTROPHILS: 8 10*3/uL (ref 1.7–8.2)
ABSOLUTE NRBC: 0 10*3/uL (ref 0.0–0.2)
BASOPHILS: 0 % (ref 0.0–2.0)
EOSINOPHILS: 1 % (ref 0.5–7.8)
HCT: 35.6 % — ABNORMAL LOW (ref 35.8–46.3)
HGB: 12 g/dL (ref 11.7–15.4)
IMMATURE GRANULOCYTES: 1 % (ref 0.0–5.0)
LYMPHOCYTES: 13 % (ref 13–44)
MCH: 28.4 PG (ref 26.1–32.9)
MCHC: 33.7 g/dL (ref 31.4–35.0)
MCV: 84.2 FL (ref 79.6–97.8)
MONOCYTES: 8 % (ref 4.0–12.0)
MPV: 10.8 FL (ref 9.4–12.3)
NEUTROPHILS: 77 % (ref 43–78)
PLATELET: 241 10*3/uL (ref 150–450)
RBC: 4.23 M/uL (ref 4.05–5.2)
RDW: 12.9 % (ref 11.9–14.6)
WBC: 10.4 10*3/uL (ref 4.3–11.1)

## 2018-10-28 LAB — METABOLIC PANEL, COMPREHENSIVE
A-G Ratio: 0.8 — ABNORMAL LOW (ref 1.2–3.5)
ALT (SGPT): 36 U/L (ref 12–65)
AST (SGOT): 23 U/L (ref 15–37)
Albumin: 3 g/dL — ABNORMAL LOW (ref 3.5–5.0)
Alk. phosphatase: 82 U/L (ref 50–136)
Anion gap: 9 mmol/L (ref 7–16)
BUN: 4 MG/DL — ABNORMAL LOW (ref 6–23)
Bilirubin, total: 0.6 MG/DL (ref 0.2–1.1)
CO2: 24 mmol/L (ref 21–32)
Calcium: 9.1 MG/DL (ref 8.3–10.4)
Chloride: 106 mmol/L (ref 98–107)
Creatinine: 0.44 MG/DL — ABNORMAL LOW (ref 0.6–1.0)
GFR est AA: >60 mL/min/{1.73_m2} (ref >60)
GFR est non-AA: >60 mL/min/{1.73_m2} (ref >60)
Globulin: 3.8 g/dL — ABNORMAL HIGH (ref 2.3–3.5)
Glucose: 95 mg/dL (ref 65–100)
Potassium: 2.8 mmol/L — CL (ref 3.5–5.1)
Protein, total: 6.8 g/dL (ref 6.3–8.2)
Sodium: 139 mmol/L (ref 136–145)

## 2018-10-28 LAB — TROPONIN-HIGH SENSITIVITY: Troponin-High Sensitivity: <3 pg/mL (ref 0–14)

## 2018-10-28 MED ORDER — POTASSIUM CHLORIDE SR 20 MEQ TAB, PARTICLES/CRYSTALS
20 mEq | ORAL | Status: AC
Start: 2018-10-28 — End: 2018-10-28
  Administered 2018-10-28: 23:00:00 via ORAL

## 2018-10-28 MED FILL — POTASSIUM CHLORIDE SR 20 MEQ TAB, PARTICLES/CRYSTALS: 20 mEq | ORAL | Qty: 2

## 2018-10-28 NOTE — ED Provider Notes (Signed)
Patient is a 28 year old female approx [redacted] weeks pregnant presents to the ER this afternoon concerned about fatigue and low blood pressure.  2 days ago she had states her bp was low and again last night.  She monitors it daily because of pre eclampsia with prior pregnancies.  She also reports a dull ache in her left chest since yesterday.  No fever, vomiting or SOB.  No vaginal bleeding or fulid leakage.  She has had prenatal care visits with documented iup    The history is provided by the patient.   Fatigue   This is a new problem. The current episode started 2 days ago. There was no focality noted. There has been no fever. Associated symptoms include chest pain. Pertinent negatives include no shortness of breath, no vomiting, no headaches, no nausea, no bowel incontinence and no bladder incontinence. Associated medical issues do not include trauma, seizures or CVA.   Hypotension    Pertinent negatives include no seizures and no weakness. Her past medical history does not include seizures or CVA.        Past Medical History:   Diagnosis Date   ??? Asthma    ??? History of placental abruption    ??? Pre-eclampsia in postpartum period    ??? Preeclampsia    ??? Umbilical hernia        Past Surgical History:   Procedure Laterality Date   ??? HX TONSILLECTOMY           Family History:   Problem Relation Age of Onset   ??? Breast Cancer Paternal Aunt    ??? Ovarian Cancer Neg Hx    ??? Uterine Cancer Neg Hx    ??? Colon Cancer Neg Hx        Social History     Socioeconomic History   ??? Marital status: SINGLE     Spouse name: Not on file   ??? Number of children: Not on file   ??? Years of education: Not on file   ??? Highest education level: Not on file   Occupational History   ??? Not on file   Social Needs   ??? Financial resource strain: Not on file   ??? Food insecurity     Worry: Not on file     Inability: Not on file   ??? Transportation needs     Medical: Not on file     Non-medical: Not on file   Tobacco Use   ??? Smoking status: Never Smoker    ??? Smokeless tobacco: Never Used   Substance and Sexual Activity   ??? Alcohol use: Not Currently   ??? Drug use: Never   ??? Sexual activity: Yes   Lifestyle   ??? Physical activity     Days per week: Not on file     Minutes per session: Not on file   ??? Stress: Not on file   Relationships   ??? Social Wellsite geologist on phone: Not on file     Gets together: Not on file     Attends religious service: Not on file     Active member of club or organization: Not on file     Attends meetings of clubs or organizations: Not on file     Relationship status: Not on file   ??? Intimate partner violence     Fear of current or ex partner: Not on file     Emotionally abused: Not on file  Physically abused: Not on file     Forced sexual activity: Not on file   Other Topics Concern   ??? Military Service Not Asked   ??? Blood Transfusions Not Asked   ??? Caffeine Concern No   ??? Occupational Exposure Not Asked   ??? Hobby Hazards Not Asked   ??? Sleep Concern Not Asked   ??? Stress Concern Not Asked   ??? Weight Concern Not Asked   ??? Special Diet Not Asked   ??? Back Care Not Asked   ??? Exercise No   ??? Bike Helmet Not Asked   ??? Seat Belt Yes   ??? Self-Exams Yes   Social History Narrative    Abuse: Feels safe at home, no history of physical abuse, no history of sexual abuse         ALLERGIES: Patient has no known allergies.    Review of Systems   Constitutional: Positive for fatigue. Negative for chills and fever.   HENT: Negative for congestion, rhinorrhea and sore throat.    Eyes: Negative for pain, discharge and visual disturbance.   Respiratory: Negative for cough and shortness of breath.    Cardiovascular: Positive for chest pain. Negative for palpitations.   Gastrointestinal: Negative for abdominal pain, bowel incontinence, diarrhea, nausea and vomiting.   Endocrine: Negative for polydipsia and polyuria.   Genitourinary: Negative for bladder incontinence, difficulty urinating, dysuria, frequency, urgency and vaginal bleeding.    Musculoskeletal: Negative for back pain and neck pain.   Skin: Negative for rash and wound.   Neurological: Negative for seizures, syncope, weakness and headaches.   Hematological: Negative.        Vitals:    10/28/18 1653   BP: 128/82   Pulse: (!) 113   Resp: 18   Temp: 98 ??F (36.7 ??C)   SpO2: 100%   Weight: 79.4 kg (175 lb)   Height: 5' (1.524 m)            Physical Exam  Vitals signs and nursing note reviewed.   Constitutional:       Appearance: Normal appearance. She is well-developed.   HENT:      Head: Normocephalic and atraumatic.   Eyes:      Conjunctiva/sclera: Conjunctivae normal.      Pupils: Pupils are equal, round, and reactive to light.   Neck:      Musculoskeletal: Normal range of motion and neck supple.   Cardiovascular:      Rate and Rhythm: Regular rhythm. Tachycardia present.      Pulses: Normal pulses.   Pulmonary:      Effort: Pulmonary effort is normal.      Breath sounds: Normal breath sounds.   Abdominal:      General: There is no distension.      Palpations: Abdomen is soft. There is no mass.      Tenderness: There is no abdominal tenderness. There is no guarding or rebound.   Musculoskeletal: Normal range of motion.         General: No deformity.   Lymphadenopathy:      Cervical: No cervical adenopathy.   Skin:     General: Skin is warm and dry.      Capillary Refill: Capillary refill takes less than 2 seconds.      Coloration: Skin is not pale.      Findings: No rash.   Neurological:      General: No focal deficit present.      Mental Status: She  is alert and oriented to person, place, and time.      GCS: GCS eye subscore is 4. GCS verbal subscore is 5. GCS motor subscore is 6.      Cranial Nerves: No cranial nerve deficit.      Sensory: No sensory deficit.      Motor: No weakness.          MDM  Number of Diagnoses or Management Options  Diagnosis management comments: I wore appropriate PPE throughout this patient's ED visit. Maryjean Ka, MD, 5:34 PM     ECG reviewed by me shows sinus tach at a rate of 103, no arrhythmia or ischemia noted    6:34 PM  Fetal heart tones 140  Urine clean  Potassium slightly low at 2.8, otherwise normal    reassured with normal BP here.      Advised to follow up with her OB    Voice dictation software was used during the making of this note.  This software is not perfect and grammatical and other typographical errors may be present.  This note has been proofread, but may still contain errors.  Maryjean Ka, MD; 10/28/2018 @6 :35 PM   ===================================================================         Amount and/or Complexity of Data Reviewed  Clinical lab tests: ordered and reviewed  Tests in the medicine section of CPT??: ordered and reviewed  Review and summarize past medical records: yes  Independent visualization of images, tracings, or specimens: yes    Risk of Complications, Morbidity, and/or Mortality  Presenting problems: low  Diagnostic procedures: low  Management options: low    Patient Progress  Patient progress: stable         Procedures

## 2018-10-28 NOTE — ED Triage Notes (Signed)
Pt is 23 weeks, c/o low blood pressure since last night with chest discomfort and feeling excessively fatigued. Masked on arrival.

## 2018-10-28 NOTE — ED Notes (Signed)

## 2018-10-28 NOTE — ED Notes (Signed)
I have reviewed discharge instructions with the patient.  The patient verbalized understanding.    Patient left ED via Discharge Method: ambulatory to Home with self.    Opportunity for questions and clarification provided.       Patient given 0 scripts.         To continue your aftercare when you leave the hospital, you may receive an automated call from our care team to check in on how you are doing.  This is a free service and part of our promise to provide the best care and service to meet your aftercare needs." If you have questions, or wish to unsubscribe from this service please call [phone].  Thank you for Choosing our [facility].

## 2018-10-28 NOTE — ED Provider Notes (Signed)
Patient is a 28 year old female approx [redacted] weeks pregnant presents to the ER this afternoon concerned about fatigue and low blood pressure.  2 days ago she had states her bp was low and again last night.  She monitors it daily because of pre eclampsia with prior pregnancies.  She also reports a dull ache in her left chest since yesterday.  No fever, vomiting or SOB.  No vaginal bleeding or fulid leakage.  She has had prenatal care visits with documented iup    The history is provided by the patient.   Fatigue   This is a new problem. The current episode started 2 days ago. There was no focality noted. There has been no fever. Associated symptoms include chest pain. Pertinent negatives include no shortness of breath, no vomiting, no headaches, no nausea, no bowel incontinence and no bladder incontinence. Associated medical issues do not include trauma, seizures or CVA.   Hypotension    Pertinent negatives include no seizures and no weakness. Her past medical history does not include seizures or CVA.        Past Medical History:   Diagnosis Date   ??? Asthma    ??? History of placental abruption    ??? Pre-eclampsia in postpartum period    ??? Preeclampsia    ??? Umbilical hernia        Past Surgical History:   Procedure Laterality Date   ??? HX TONSILLECTOMY           Family History:   Problem Relation Age of Onset   ??? Breast Cancer Paternal Aunt    ??? Ovarian Cancer Neg Hx    ??? Uterine Cancer Neg Hx    ??? Colon Cancer Neg Hx        Social History     Socioeconomic History   ??? Marital status: SINGLE     Spouse name: Not on file   ??? Number of children: Not on file   ??? Years of education: Not on file   ??? Highest education level: Not on file   Occupational History   ??? Not on file   Social Needs   ??? Financial resource strain: Not on file   ??? Food insecurity     Worry: Not on file     Inability: Not on file   ??? Transportation needs     Medical: Not on file     Non-medical: Not on file   Tobacco Use   ??? Smoking status: Never Smoker   ???  Smokeless tobacco: Never Used   Substance and Sexual Activity   ??? Alcohol use: Not Currently   ??? Drug use: Never   ??? Sexual activity: Yes   Lifestyle   ??? Physical activity     Days per week: Not on file     Minutes per session: Not on file   ??? Stress: Not on file   Relationships   ??? Social Product manager on phone: Not on file     Gets together: Not on file     Attends religious service: Not on file     Active member of club or organization: Not on file     Attends meetings of clubs or organizations: Not on file     Relationship status: Not on file   ??? Intimate partner violence     Fear of current or ex partner: Not on file     Emotionally abused: Not on file  Physically abused: Not on file     Forced sexual activity: Not on file   Other Topics Concern   ??? Military Service Not Asked   ??? Blood Transfusions Not Asked   ??? Caffeine Concern No   ??? Occupational Exposure Not Asked   ??? Hobby Hazards Not Asked   ??? Sleep Concern Not Asked   ??? Stress Concern Not Asked   ??? Weight Concern Not Asked   ??? Special Diet Not Asked   ??? Back Care Not Asked   ??? Exercise No   ??? Bike Helmet Not Asked   ??? Seat Belt Yes   ??? Self-Exams Yes   Social History Narrative    Abuse: Feels safe at home, no history of physical abuse, no history of sexual abuse         ALLERGIES: Patient has no known allergies.    Review of Systems   Constitutional: Positive for fatigue. Negative for chills and fever.   HENT: Negative for congestion, rhinorrhea and sore throat.    Eyes: Negative for pain, discharge and visual disturbance.   Respiratory: Negative for cough and shortness of breath.    Cardiovascular: Positive for chest pain. Negative for palpitations.   Gastrointestinal: Negative for abdominal pain, bowel incontinence, diarrhea, nausea and vomiting.   Endocrine: Negative for polydipsia and polyuria.   Genitourinary: Negative for bladder incontinence, difficulty urinating, dysuria, frequency, urgency and vaginal bleeding.   Musculoskeletal:  Negative for back pain and neck pain.   Skin: Negative for rash and wound.   Neurological: Negative for seizures, syncope, weakness and headaches.   Hematological: Negative.        Vitals:    10/28/18 1653   BP: 128/82   Pulse: (!) 113   Resp: 18   Temp: 98 ??F (36.7 ??C)   SpO2: 100%   Weight: 79.4 kg (175 lb)   Height: 5' (1.524 m)            Physical Exam  Vitals signs and nursing note reviewed.   Constitutional:       Appearance: Normal appearance. She is well-developed.   HENT:      Head: Normocephalic and atraumatic.   Eyes:      Conjunctiva/sclera: Conjunctivae normal.      Pupils: Pupils are equal, round, and reactive to light.   Neck:      Musculoskeletal: Normal range of motion and neck supple.   Cardiovascular:      Rate and Rhythm: Regular rhythm. Tachycardia present.      Pulses: Normal pulses.   Pulmonary:      Effort: Pulmonary effort is normal.      Breath sounds: Normal breath sounds.   Abdominal:      General: There is no distension.      Palpations: Abdomen is soft. There is no mass.      Tenderness: There is no abdominal tenderness. There is no guarding or rebound.   Musculoskeletal: Normal range of motion.         General: No deformity.   Lymphadenopathy:      Cervical: No cervical adenopathy.   Skin:     General: Skin is warm and dry.      Capillary Refill: Capillary refill takes less than 2 seconds.      Coloration: Skin is not pale.      Findings: No rash.   Neurological:      General: No focal deficit present.      Mental Status: She  is alert and oriented to person, place, and time.      GCS: GCS eye subscore is 4. GCS verbal subscore is 5. GCS motor subscore is 6.      Cranial Nerves: No cranial nerve deficit.      Sensory: No sensory deficit.      Motor: No weakness.          MDM  Number of Diagnoses or Management Options  Diagnosis management comments: I wore appropriate PPE throughout this patient's ED visit. Maryjean Ka, MD, 5:34 PM    ECG reviewed by me shows sinus tach at a rate of  103, no arrhythmia or ischemia noted    6:34 PM  Fetal heart tones 140  Urine clean  Potassium slightly low at 2.8, otherwise normal    reassured with normal BP here.      Advised to follow up with her OB    Voice dictation software was used during the making of this note.  This software is not perfect and grammatical and other typographical errors may be present.  This note has been proofread, but may still contain errors.  Maryjean Ka, MD; 10/28/2018 @6 :35 PM   ===================================================================         Amount and/or Complexity of Data Reviewed  Clinical lab tests: ordered and reviewed  Tests in the medicine section of CPT??: ordered and reviewed  Review and summarize past medical records: yes  Independent visualization of images, tracings, or specimens: yes    Risk of Complications, Morbidity, and/or Mortality  Presenting problems: low  Diagnostic procedures: low  Management options: low    Patient Progress  Patient progress: stable         Procedures

## 2018-10-28 NOTE — ED Notes (Signed)
Pt is 23 weeks, c/o low blood pressure since last night with chest discomfort and feeling excessively fatigued. Masked on arrival.

## 2018-10-29 LAB — EKG 12-LEAD
Atrial Rate: 103 {beats}/min
P Axis: 33 degrees
P-R Interval: 138 ms
Q-T Interval: 332 ms
QRS Duration: 70 ms
QTc Calculation (Bazett): 434 ms
R Axis: 32 degrees
T Axis: 11 degrees
Ventricular Rate: 103 {beats}/min

## 2018-10-29 LAB — EKG, 12 LEAD, INITIAL
Atrial Rate: 103 {beats}/min
Calculated P Axis: 33 degrees
Calculated R Axis: 32 degrees
Calculated T Axis: 11 degrees
P-R Interval: 138 ms
Q-T Interval: 332 ms
QRS Duration: 70 ms
QTC Calculation (Bezet): 434 ms
Ventricular Rate: 103 {beats}/min

## 2018-11-06 ENCOUNTER — Ambulatory Visit: Attending: Women's Health

## 2018-11-06 ENCOUNTER — Ambulatory Visit: Admit: 2018-11-06 | Discharge: 2018-11-06 | Payer: PRIVATE HEALTH INSURANCE | Attending: Women's Health

## 2018-11-06 DIAGNOSIS — E876 Hypokalemia: Secondary | ICD-10-CM

## 2018-11-06 NOTE — Progress Notes (Signed)
Patient comes in today for routine prenatal visit. Patient states she has episodes 2-3 times a week where when she lays down her heart beats fast, so she will sit up and the symptom subsides. Patient stated the episodes started couple weeks ago.     Fetal Movements:  Yes  Contractions:  No  Vaginal Bleeding:  No  Leaking Fluid:  No  GI/GU issues:  No    Visit Vitals  BP 130/72 (BP 1 Location: Left arm, BP Patient Position: Sitting)   Pulse 98   Temp 97.9 ??F (36.6 ??C) (Temporal)   Ht 5' (1.524 m)   Wt 183 lb (83 kg)   SpO2 99%   Breastfeeding No   BMI 35.74 kg/m??         Elta Angell L Diasia Henken  11/06/18  8:22 AM

## 2018-11-06 NOTE — Assessment & Plan Note (Addendum)
Noted  BP nl today  Denies HA, vision changes, RUQ/epigastric pain, N/V or swelling.

## 2018-11-06 NOTE — Assessment & Plan Note (Signed)
PTL/labor precautions, FMC, and pregnancy warning signs reviewed.  RTO 4 weeks for OBV and glucola

## 2018-11-06 NOTE — Assessment & Plan Note (Signed)
Positional, liely physiologic in pregnancy    We discuss not lying suping, will check TSH/electrolytes today, CBC nl 1 week ago  If persistent, plan referral to cardiology for holter/echo

## 2018-11-06 NOTE — Patient Instructions (Signed)
Thank you for coming.  Return to the office in 4 weeks.  Please call if you have any abdominal pain and 5 contractions in 1 hour, vaginal bleeding or spotting, or leaking of fluid.  Please call immediately if you have a headache that won't go away with tylenol, changes to your vision like funny lights or blurriness, nausea or vomiting, upper abdominal pain, or if the baby does not move at least 10 times in 2 hours.

## 2018-11-06 NOTE — Progress Notes (Signed)
This is a 28 y.o.  G6 P2123 at 9098w3d for routine OB visit.    Her Estimated Due Date is 02/23/2019, by Last Menstrual Period    Denies leaking of fluid, vaginal bleeding, or regular contractions. Reports fetal movement. Denies severe or persistent HA, vision changes, RUQ/epigastric pain, N/V or swelling.    Pt reports in the last 2 weeks she has had 6 episodes of feeling like her heart is racing. Occurs when she is lying down. When she sits up, the palpitations resolve. Denies CP, SOB, or cough. Denies syncope. Denies any cardiac hx. Known asthma, but denies any recent symptoms.       Current Outpatient Medications on File Prior to Visit   Medication Sig Dispense Refill   ??? PNV no.153/FA/om3/dha/epa/fish (PRENATAL GUMMIES PO) Take  by mouth.     ??? aspirin delayed-release 81 mg tablet Take 2 Tabs by mouth daily. 60 Tab 11     No current facility-administered medications on file prior to visit.        No Known Allergies    OB History   Gravida Para Term Preterm AB Living   6 3 2 1 2 3    SAB TAB Ectopic Molar Multiple Live Births   0 0 0 0 0 3       # 1 - Date: None, Sex: None, Weight: None, GA: None, Delivery: None, Apgar1: None, Apgar5: None, Living: None, Birth Comments: None    # 2 - Date: None, Sex: None, Weight: None, GA: None, Delivery: None, Apgar1: None, Apgar5: None, Living: None, Birth Comments: None    # 3 - Date: 2012, Sex: Female, Weight: 5 lb 9 oz (2.523 kg), GA: 2633w0d, Delivery: Vaginal, Spontaneous, Apgar1: None, Apgar5: None, Living: Living, Birth Comments: None    # 4 - Date: 152015, Sex: None, Weight: 6 lb (2.722 kg), GA: 5631w0d, Delivery: Vaginal, Spontaneous, Apgar1: None, Apgar5: None, Living: Living, Birth Comments: None    # 5 - Date: 2017, Sex: None, Weight: 7 lb (3.175 kg), GA: 874w0d, Delivery: Vaginal, Spontaneous, Apgar1: None, Apgar5: None, Living: Living, Birth Comments: None     # 6 - Date: None, Sex: None, Weight: None, GA: None, Delivery: None, Apgar1: None, Apgar5: None, Living: None, Birth Comments: None        Past Medical History:   Diagnosis Date   ??? Asthma    ??? History of placental abruption    ??? Pre-eclampsia in postpartum period    ??? Preeclampsia    ??? Umbilical hernia        Past Surgical History:   Procedure Laterality Date   ??? HX TONSILLECTOMY         Family History   Problem Relation Age of Onset   ??? Breast Cancer Paternal Aunt    ??? Ovarian Cancer Neg Hx    ??? Uterine Cancer Neg Hx    ??? Colon Cancer Neg Hx        Social History     Socioeconomic History   ??? Marital status: SINGLE     Spouse name: Not on file   ??? Number of children: Not on file   ??? Years of education: Not on file   ??? Highest education level: Not on file   Occupational History   ??? Not on file   Social Needs   ??? Financial resource strain: Not on file   ??? Food insecurity     Worry: Not on file     Inability: Not on  file   ??? Transportation needs     Medical: Not on file     Non-medical: Not on file   Tobacco Use   ??? Smoking status: Never Smoker   ??? Smokeless tobacco: Never Used   Substance and Sexual Activity   ??? Alcohol use: Not Currently   ??? Drug use: Never   ??? Sexual activity: Yes   Lifestyle   ??? Physical activity     Days per week: Not on file     Minutes per session: Not on file   ??? Stress: Not on file   Relationships   ??? Social Product manager on phone: Not on file     Gets together: Not on file     Attends religious service: Not on file     Active member of club or organization: Not on file     Attends meetings of clubs or organizations: Not on file     Relationship status: Not on file   ??? Intimate partner violence     Fear of current or ex partner: Not on file     Emotionally abused: Not on file     Physically abused: Not on file     Forced sexual activity: Not on file   Other Topics Concern   ??? Military Service Not Asked   ??? Blood Transfusions Not Asked   ??? Caffeine Concern No    ??? Occupational Exposure Not Asked   ??? Hobby Hazards Not Asked   ??? Sleep Concern Not Asked   ??? Stress Concern Not Asked   ??? Weight Concern Not Asked   ??? Special Diet Not Asked   ??? Back Care Not Asked   ??? Exercise No   ??? Bike Helmet Not Asked   ??? Seat Belt Yes   ??? Self-Exams Yes   Social History Narrative    Abuse: Feels safe at home, no history of physical abuse, no history of sexual abuse           Objective    Visit Vitals  BP 130/72 (BP 1 Location: Left arm, BP Patient Position: Sitting)   Pulse 98   Temp 97.9 ??F (36.6 ??C) (Temporal)   Ht 5' (1.524 m)   Wt 183 lb (83 kg)   SpO2 99%   Breastfeeding No   BMI 35.74 kg/m??       General: well developed, well nourished, in no acute distress    Head: normocephalic and atraumatic    Resp: even and unlabored, lung sounds nl    Heart: RRR, no murmurs noted    Psych: Normal mood and affect        Assessment and Plan      Problem List  Never Reviewed          Codes Class    Hypokalemia ICD-10-CM: E87.6  ICD-9-CM: 276.8     Overview Signed 11/06/2018  8:26 AM by Marcelino Freestone, NP     10/28/2018: K+ 2.8 mEq in ER, received 40 mEQ IV             Heart palpitations ICD-10-CM: R00.2  ICD-9-CM: 785.1         Multigravida in second trimester ICD-10-CM: Z34.82  ICD-9-CM: V22.1     Overview Addendum 09/24/2018  7:51 AM by Marcelino Freestone, NP     EDD 02/23/2019 by LMP c/w 16wk [place]    09/10/2018: AFP, CF, SMA negative  Asthma during pregnancy ICD-10-CM: O99.519, J45.909  ICD-9-CM: 648.93, 493.90     Overview Signed 09/09/2018  3:22 PM by Vernell Barrier, NP     Rare inhaler use (1-2/year)             History of pre-eclampsia in prior pregnancy, currently pregnant in second trimester ICD-10-CM: O09.292  ICD-9-CM: V23.49     Overview Addendum 09/13/2018  7:32 AM by Vernell Barrier, NP     Hx of preeclampsia (requiring delivery at 36 weeks) and a Hx of postpartum preeclampsia  Baseline pre-e labs/urine, Start ASA 162 mg daily    09/11/2018: 24 hr urine protein 158              History of placenta abruption ICD-10-CM: Z87.59  ICD-9-CM: V13.29               Problem List Items Addressed This Visit        Other    Heart palpitations     Positional, liely physiologic in pregnancy    We discuss not lying suping, will check TSH/electrolytes today, CBC nl 1 week ago  If persistent, plan referral to cardiology for holter/echo         History of pre-eclampsia in prior pregnancy, currently pregnant in second trimester     Noted  BP nl today  Denies HA, vision changes, RUQ/epigastric pain, N/V or swelling.           Hypokalemia    Relevant Orders    METABOLIC PANEL, COMPREHENSIVE    TSH REFLEX TO T4    MAGNESIUM    Multigravida in second trimester     PTL/labor precautions, FMC, and pregnancy warning signs reviewed.  RTO 4 weeks for OBV and glucola               Orders Placed This Encounter   ??? CMP   ??? TSH REFLEX TO T4   ??? MAGNESIUM       Outpatient Encounter Medications as of 11/06/2018   Medication Sig Dispense Refill   ??? PNV no.153/FA/om3/dha/epa/fish (PRENATAL GUMMIES PO) Take  by mouth.     ??? aspirin delayed-release 81 mg tablet Take 2 Tabs by mouth daily. 60 Tab 11     No facility-administered encounter medications on file as of 11/06/2018.                Labor signs, pregnancy warning signs, and fetal movement counting reviewed (if applicable)        Vernell Barrier, NP 11/06/18 8:38 AM

## 2018-11-06 NOTE — Progress Notes (Signed)
I have reviewed the patient's visit today including history, exam and assessment by Tyra Taylor, WHNP-BC.  I agree with treatment/plan as above.    Casmir Auguste Robert Yechezkel Fertig, MD  8:59 AM  11/06/18

## 2018-11-06 NOTE — Assessment & Plan Note (Signed)
PTL/labor precautions, FMC, and pregnancy warning signs reviewed.  RTO 4 weeks for OBV and glucola

## 2018-11-06 NOTE — Progress Notes (Signed)
This is a 28 y.o.  G6 P2123 at 1339w3d for routine OB visit.    Her Estimated Due Date is 02/23/2019, by Last Menstrual Period    Denies leaking of fluid, vaginal bleeding, or regular contractions. Reports fetal movement. Denies severe or persistent HA, vision changes, RUQ/epigastric pain, N/V or swelling.    Pt reports in the last 2 weeks she has had 6 episodes of feeling like her heart is racing. Occurs when she is lying down. When she sits up, the palpitations resolve. Denies CP, SOB, or cough. Denies syncope. Denies any cardiac hx. Known asthma, but denies any recent symptoms.       Current Outpatient Medications on File Prior to Visit   Medication Sig Dispense Refill   ??? PNV no.153/FA/om3/dha/epa/fish (PRENATAL GUMMIES PO) Take  by mouth.     ??? aspirin delayed-release 81 mg tablet Take 2 Tabs by mouth daily. 60 Tab 11     No current facility-administered medications on file prior to visit.        No Known Allergies    OB History   Gravida Para Term Preterm AB Living   6 3 2 1 2 3    SAB TAB Ectopic Molar Multiple Live Births   0 0 0 0 0 3       # 1 - Date: None, Sex: None, Weight: None, GA: None, Delivery: None, Apgar1: None, Apgar5: None, Living: None, Birth Comments: None    # 2 - Date: None, Sex: None, Weight: None, GA: None, Delivery: None, Apgar1: None, Apgar5: None, Living: None, Birth Comments: None    # 3 - Date: 2012, Sex: Female, Weight: 5 lb 9 oz (2.523 kg), GA: 7627w0d, Delivery: Vaginal, Spontaneous, Apgar1: None, Apgar5: None, Living: Living, Birth Comments: None    # 4 - Date: 562015, Sex: None, Weight: 6 lb (2.722 kg), GA: 6932w0d, Delivery: Vaginal, Spontaneous, Apgar1: None, Apgar5: None, Living: Living, Birth Comments: None    # 5 - Date: 2017, Sex: None, Weight: 7 lb (3.175 kg), GA: 2846w0d, Delivery: Vaginal, Spontaneous, Apgar1: None, Apgar5: None, Living: Living, Birth Comments: None    # 6 - Date: None, Sex: None, Weight: None, GA: None, Delivery: None, Apgar1: None, Apgar5: None, Living: None, Birth  Comments: None        Past Medical History:   Diagnosis Date   ??? Asthma    ??? History of placental abruption    ??? Pre-eclampsia in postpartum period    ??? Preeclampsia    ??? Umbilical hernia        Past Surgical History:   Procedure Laterality Date   ??? HX TONSILLECTOMY         Family History   Problem Relation Age of Onset   ??? Breast Cancer Paternal Aunt    ??? Ovarian Cancer Neg Hx    ??? Uterine Cancer Neg Hx    ??? Colon Cancer Neg Hx        Social History     Socioeconomic History   ??? Marital status: SINGLE     Spouse name: Not on file   ??? Number of children: Not on file   ??? Years of education: Not on file   ??? Highest education level: Not on file   Occupational History   ??? Not on file   Social Needs   ??? Financial resource strain: Not on file   ??? Food insecurity     Worry: Not on file     Inability: Not on  file   ??? Transportation needs     Medical: Not on file     Non-medical: Not on file   Tobacco Use   ??? Smoking status: Never Smoker   ??? Smokeless tobacco: Never Used   Substance and Sexual Activity   ??? Alcohol use: Not Currently   ??? Drug use: Never   ??? Sexual activity: Yes   Lifestyle   ??? Physical activity     Days per week: Not on file     Minutes per session: Not on file   ??? Stress: Not on file   Relationships   ??? Social Product manager on phone: Not on file     Gets together: Not on file     Attends religious service: Not on file     Active member of club or organization: Not on file     Attends meetings of clubs or organizations: Not on file     Relationship status: Not on file   ??? Intimate partner violence     Fear of current or ex partner: Not on file     Emotionally abused: Not on file     Physically abused: Not on file     Forced sexual activity: Not on file   Other Topics Concern   ??? Military Service Not Asked   ??? Blood Transfusions Not Asked   ??? Caffeine Concern No   ??? Occupational Exposure Not Asked   ??? Hobby Hazards Not Asked   ??? Sleep Concern Not Asked   ??? Stress Concern Not Asked   ??? Weight Concern  Not Asked   ??? Special Diet Not Asked   ??? Back Care Not Asked   ??? Exercise No   ??? Bike Helmet Not Asked   ??? Seat Belt Yes   ??? Self-Exams Yes   Social History Narrative    Abuse: Feels safe at home, no history of physical abuse, no history of sexual abuse           Objective    Visit Vitals  BP 130/72 (BP 1 Location: Left arm, BP Patient Position: Sitting)   Pulse 98   Temp 97.9 ??F (36.6 ??C) (Temporal)   Ht 5' (1.524 m)   Wt 183 lb (83 kg)   SpO2 99%   Breastfeeding No   BMI 35.74 kg/m??       General: well developed, well nourished, in no acute distress    Head: normocephalic and atraumatic    Resp: even and unlabored, lung sounds nl    Heart: RRR, no murmurs noted    Psych: Normal mood and affect        Assessment and Plan      Problem List  Never Reviewed          Codes Class    Hypokalemia ICD-10-CM: E87.6  ICD-9-CM: 276.8     Overview Signed 11/06/2018  8:26 AM by Marcelino Freestone, NP     10/28/2018: K+ 2.8 mEq in ER, received 40 mEQ IV             Heart palpitations ICD-10-CM: R00.2  ICD-9-CM: 785.1         Multigravida in second trimester ICD-10-CM: Z34.82  ICD-9-CM: V22.1     Overview Addendum 09/24/2018  7:51 AM by Marcelino Freestone, NP     EDD 02/23/2019 by LMP c/w 16wk [place]    09/10/2018: AFP, CF, SMA negative  Asthma during pregnancy ICD-10-CM: O99.519, J45.909  ICD-9-CM: 648.93, 493.90     Overview Signed 09/09/2018  3:22 PM by Vernell Barrier, NP     Rare inhaler use (1-2/year)             History of pre-eclampsia in prior pregnancy, currently pregnant in second trimester ICD-10-CM: O09.292  ICD-9-CM: V23.49     Overview Addendum 09/13/2018  7:32 AM by Vernell Barrier, NP     Hx of preeclampsia (requiring delivery at 36 weeks) and a Hx of postpartum preeclampsia  Baseline pre-e labs/urine, Start ASA 162 mg daily    09/11/2018: 24 hr urine protein 158             History of placenta abruption ICD-10-CM: Z87.59  ICD-9-CM: V13.29               Problem List Items Addressed This Visit        Other    Heart palpitations      Positional, liely physiologic in pregnancy    We discuss not lying suping, will check TSH/electrolytes today, CBC nl 1 week ago  If persistent, plan referral to cardiology for holter/echo         History of pre-eclampsia in prior pregnancy, currently pregnant in second trimester     Noted  BP nl today  Denies HA, vision changes, RUQ/epigastric pain, N/V or swelling.           Hypokalemia    Relevant Orders    METABOLIC PANEL, COMPREHENSIVE    TSH REFLEX TO T4    MAGNESIUM    Multigravida in second trimester     PTL/labor precautions, FMC, and pregnancy warning signs reviewed.  RTO 4 weeks for OBV and glucola               Orders Placed This Encounter   ??? CMP   ??? TSH REFLEX TO T4   ??? MAGNESIUM       Outpatient Encounter Medications as of 11/06/2018   Medication Sig Dispense Refill   ??? PNV no.153/FA/om3/dha/epa/fish (PRENATAL GUMMIES PO) Take  by mouth.     ??? aspirin delayed-release 81 mg tablet Take 2 Tabs by mouth daily. 60 Tab 11     No facility-administered encounter medications on file as of 11/06/2018.                Labor signs, pregnancy warning signs, and fetal movement counting reviewed (if applicable)        Vernell Barrier, NP 11/06/18 8:38 AM

## 2018-11-06 NOTE — Progress Notes (Signed)
I have reviewed the patient's visit today including history, exam and assessment by Memory Argue, WHNP-BC.  I agree with treatment/plan as above.    Angela Burke, MD  8:59 AM  11/06/18

## 2018-11-06 NOTE — Assessment & Plan Note (Signed)
Positional, liely physiologic in pregnancy    We discuss not lying suping, will check TSH/electrolytes today, CBC nl 1 week ago  If persistent, plan referral to cardiology for holter/echo

## 2018-11-06 NOTE — Progress Notes (Signed)
Patient comes in today for routine prenatal visit. Patient states she has episodes 2-3 times a week where when she lays down her heart beats fast, so she will sit up and the symptom subsides. Patient stated the episodes started couple weeks ago.     Fetal Movements:  Yes  Contractions:  No  Vaginal Bleeding:  No  Leaking Fluid:  No  GI/GU issues:  No    Visit Vitals  BP 130/72 (BP 1 Location: Left arm, BP Patient Position: Sitting)   Pulse 98   Temp 97.9 F (36.6 C) (Temporal)   Ht 5' (1.524 m)   Wt 183 lb (83 kg)   SpO2 99%   Breastfeeding No   BMI 35.74 kg/m         Missie L Cantrell  11/06/18  8:22 AM

## 2018-11-06 NOTE — Assessment & Plan Note (Signed)
Noted  BP nl today  Denies HA, vision changes, RUQ/epigastric pain, N/V or swelling.

## 2018-11-07 LAB — TSH, REFLEX TO T4: TSH: 2.21 u[IU]/mL (ref 0.450–4.500)

## 2018-11-07 LAB — COMPREHENSIVE METABOLIC PANEL
ALT: 26 IU/L (ref 0–32)
AST: 21 IU/L (ref 0–40)
Albumin/Globulin Ratio: 1.7 NA (ref 1.2–2.2)
Albumin: 3.7 g/dL — ABNORMAL LOW (ref 3.9–5.0)
Alkaline Phosphatase: 86 IU/L (ref 39–117)
BUN: 6 mg/dL (ref 6–20)
Bun/Cre Ratio: 15 NA (ref 9–23)
CO2: 18 mmol/L — ABNORMAL LOW (ref 20–29)
Calcium: 9.3 mg/dL (ref 8.7–10.2)
Chloride: 105 mmol/L (ref 96–106)
Creatinine: 0.41 mg/dL — ABNORMAL LOW (ref 0.57–1.00)
EGFR IF NonAfrican American: 140 mL/min/{1.73_m2} (ref >59)
GFR African American: 161 mL/min/{1.73_m2} (ref >59)
Globulin, Total: 2.2 g/dL (ref 1.5–4.5)
Glucose: 103 mg/dL — ABNORMAL HIGH (ref 65–99)
Potassium: 3.6 mmol/L (ref 3.5–5.2)
Sodium: 139 mmol/L (ref 134–144)
Total Bilirubin: 0.4 mg/dL (ref 0.0–1.2)
Total Protein: 5.9 g/dL — ABNORMAL LOW (ref 6.0–8.5)

## 2018-11-07 LAB — MAGNESIUM
Magnesium: 1.7 mg/dL (ref 1.6–2.3)
Magnesium: 1.7 mg/dL (ref 1.6–2.3)

## 2018-11-07 LAB — METABOLIC PANEL, COMPREHENSIVE
A-G Ratio: 1.7 (ref 1.2–2.2)
ALT (SGPT): 26 IU/L (ref 0–32)
AST (SGOT): 21 IU/L (ref 0–40)
Albumin: 3.7 g/dL — ABNORMAL LOW (ref 3.9–5.0)
Alk. phosphatase: 86 IU/L (ref 39–117)
BUN/Creatinine ratio: 15 (ref 9–23)
BUN: 6 mg/dL (ref 6–20)
Bilirubin, total: 0.4 mg/dL (ref 0.0–1.2)
CO2: 18 mmol/L — ABNORMAL LOW (ref 20–29)
Calcium: 9.3 mg/dL (ref 8.7–10.2)
Chloride: 105 mmol/L (ref 96–106)
Creatinine: 0.41 mg/dL — ABNORMAL LOW (ref 0.57–1.00)
GFR est AA: 161 mL/min/{1.73_m2} (ref >59)
GFR est non-AA: 140 mL/min/{1.73_m2} (ref >59)
GLOBULIN, TOTAL: 2.2 g/dL (ref 1.5–4.5)
Glucose: 103 mg/dL — ABNORMAL HIGH (ref 65–99)
Potassium: 3.6 mmol/L (ref 3.5–5.2)
Protein, total: 5.9 g/dL — ABNORMAL LOW (ref 6.0–8.5)
Sodium: 139 mmol/L (ref 134–144)

## 2018-11-07 LAB — TSH REFLEX TO T4: TSH: 2.21 u[IU]/mL (ref 0.450–4.500)

## 2018-11-12 ENCOUNTER — Encounter: Attending: Women's Health

## 2018-12-06 ENCOUNTER — Encounter: Attending: Women's Health

## 2018-12-17 ENCOUNTER — Ambulatory Visit: Attending: Women's Health

## 2018-12-17 ENCOUNTER — Ambulatory Visit: Admit: 2018-12-17 | Discharge: 2018-12-17 | Payer: BLUE CROSS/BLUE SHIELD | Attending: Women's Health

## 2018-12-17 DIAGNOSIS — E876 Hypokalemia: Secondary | ICD-10-CM

## 2018-12-17 NOTE — Assessment & Plan Note (Signed)
PTL/labor precautions, FMC, and pregnancy warning signs reviewed.  RTO 2 weeks OBV with US

## 2018-12-17 NOTE — Assessment & Plan Note (Signed)
BP nl  Denies HA, vision changes, RUQ/epigastric pain, N/V or swelling.  Continue ASA

## 2018-12-17 NOTE — Assessment & Plan Note (Signed)
noted 

## 2018-12-17 NOTE — Patient Instructions (Signed)
Thank you for coming.  Return to the office in 2 weeks.  Please call if you have any abdominal pain and 5 contractions in 1 hour, vaginal bleeding or spotting, or leaking of fluid.  You should feel the baby move 10 times in an hour. Monitor this once a day. If you do not feel the baby move this often please call.  Please call immediately if you have a headache that won't go away with tylenol, changes to your vision like funny lights or blurriness, nausea or vomiting, upper abdominal pain, or if the baby does not move at least 10 times in 2 hours.

## 2018-12-17 NOTE — Progress Notes (Signed)
Patient comes in today for routine prenatal visit. No complaints/concerns today.    Finished Glucola @ 8:58 am    Fetal Movements:  Yes  Contractions:  No  Vaginal Bleeding:  No  Leaking Fluid:  No  GI/GU issues:  No    Visit Vitals  BP 112/70 (BP 1 Location: Left arm, BP Patient Position: Sitting)   Temp 97.9 ??F (36.6 ??C) (Temporal)   Ht 5' (1.524 m)   Wt 188 lb (85.3 kg)   Breastfeeding No   BMI 36.72 kg/m??         Bryttani Blew L Brayon Bielefeld  12/17/18  8:50 AM

## 2018-12-17 NOTE — Progress Notes (Signed)
This is a 28 y.o.  G6 P2123 at 1523w2d for routine OB visit.    Her Estimated Due Date is 02/23/2019, by Last Menstrual Period    Denies leaking of fluid, vaginal bleeding, or regular contractions. Reports fetal movement. Denies severe or persistent HA, vision changes, RUQ/epigastric pain, N/V or swelling.      Current Outpatient Medications on File Prior to Visit   Medication Sig Dispense Refill   ??? PNV no.153/FA/om3/dha/epa/fish (PRENATAL GUMMIES PO) Take  by mouth.     ??? aspirin delayed-release 81 mg tablet Take 2 Tabs by mouth daily. 60 Tab 11     No current facility-administered medications on file prior to visit.        No Known Allergies    OB History   Gravida Para Term Preterm AB Living   6 3 2 1 2 3    SAB TAB Ectopic Molar Multiple Live Births   0 0 0 0 0 3       # 1 - Date: None, Sex: None, Weight: None, GA: None, Delivery: None, Apgar1: None, Apgar5: None, Living: None, Birth Comments: None    # 2 - Date: None, Sex: None, Weight: None, GA: None, Delivery: None, Apgar1: None, Apgar5: None, Living: None, Birth Comments: None    # 3 - Date: 2012, Sex: Female, Weight: 5 lb 9 oz (2.523 kg), GA: 5113w0d, Delivery: Vaginal, Spontaneous, Apgar1: None, Apgar5: None, Living: Living, Birth Comments: None    # 4 - Date: 752015, Sex: None, Weight: 6 lb (2.722 kg), GA: 3066w0d, Delivery: Vaginal, Spontaneous, Apgar1: None, Apgar5: None, Living: Living, Birth Comments: None    # 5 - Date: 2017, Sex: None, Weight: 7 lb (3.175 kg), GA: 3721w0d, Delivery: Vaginal, Spontaneous, Apgar1: None, Apgar5: None, Living: Living, Birth Comments: None    # 6 - Date: None, Sex: None, Weight: None, GA: None, Delivery: None, Apgar1: None, Apgar5: None, Living: None, Birth Comments: None        Past Medical History:   Diagnosis Date   ??? Asthma    ??? History of placental abruption    ??? Pre-eclampsia in postpartum period    ??? Preeclampsia    ??? Umbilical hernia        Past Surgical History:   Procedure Laterality Date   ??? HX TONSILLECTOMY          Family History   Problem Relation Age of Onset   ??? Breast Cancer Paternal Aunt    ??? Ovarian Cancer Neg Hx    ??? Uterine Cancer Neg Hx    ??? Colon Cancer Neg Hx        Social History     Socioeconomic History   ??? Marital status: MARRIED     Spouse name: Not on file   ??? Number of children: Not on file   ??? Years of education: Not on file   ??? Highest education level: Not on file   Occupational History   ??? Not on file   Social Needs   ??? Financial resource strain: Not on file   ??? Food insecurity     Worry: Not on file     Inability: Not on file   ??? Transportation needs     Medical: Not on file     Non-medical: Not on file   Tobacco Use   ??? Smoking status: Never Smoker   ??? Smokeless tobacco: Never Used   Substance and Sexual Activity   ??? Alcohol use: Not Currently   ???  Drug use: Never   ??? Sexual activity: Yes   Lifestyle   ??? Physical activity     Days per week: Not on file     Minutes per session: Not on file   ??? Stress: Not on file   Relationships   ??? Social Product manager on phone: Not on file     Gets together: Not on file     Attends religious service: Not on file     Active member of club or organization: Not on file     Attends meetings of clubs or organizations: Not on file     Relationship status: Not on file   ??? Intimate partner violence     Fear of current or ex partner: Not on file     Emotionally abused: Not on file     Physically abused: Not on file     Forced sexual activity: Not on file   Other Topics Concern   ??? Military Service Not Asked   ??? Blood Transfusions Not Asked   ??? Caffeine Concern No   ??? Occupational Exposure Not Asked   ??? Hobby Hazards Not Asked   ??? Sleep Concern Not Asked   ??? Stress Concern Not Asked   ??? Weight Concern Not Asked   ??? Special Diet Not Asked   ??? Back Care Not Asked   ??? Exercise No   ??? Bike Helmet Not Asked   ??? Seat Belt Yes   ??? Self-Exams Yes   Social History Narrative    Abuse: Feels safe at home, no history of physical abuse, no history of sexual abuse           Objective     Visit Vitals  BP 112/70 (BP 1 Location: Left arm, BP Patient Position: Sitting)   Temp 97.9 ??F (36.6 ??C) (Temporal)   Ht 5' (1.524 m)   Wt 188 lb (85.3 kg)   Breastfeeding No   BMI 36.72 kg/m??       General: well developed, well nourished, in no acute distress    Head: normocephalic and atraumatic    Resp: even and unlabored    Psych: Normal mood and affect        Assessment and Plan      Problem List  Never Reviewed          Codes Class    Hypokalemia ICD-10-CM: E87.6  ICD-9-CM: 276.8     Overview Signed 11/06/2018  8:26 AM by Marcelino Freestone, NP     10/28/2018: K+ 2.8 mEq in ER, received 40 mEQ IV             Heart palpitations ICD-10-CM: R00.2  ICD-9-CM: 785.1         Multigravida in third trimester ICD-10-CM: Z34.83  ICD-9-CM: V22.1     Overview Addendum 12/17/2018  9:04 AM by Marcelino Freestone, NP     EDD 02/23/2019 by LMP c/w 16wk [place]    09/10/2018: AFP, CF, SMA negative  12/17/2018: Flu vaccine/TDAP recommended, Plans breastfeeding, desires BTL             Asthma during pregnancy ICD-10-CM: O99.519, J45.909  ICD-9-CM: 648.93, 493.90     Overview Signed 09/09/2018  3:22 PM by Marcelino Freestone, NP     Rare inhaler use (1-2/year)             History of pre-eclampsia in prior pregnancy, currently pregnant in third trimester ICD-10-CM: O09.293  ICD-9-CM: V23.49  Overview Addendum 09/13/2018  7:32 AM by Vernell Barrier, NP     Hx of preeclampsia (requiring delivery at 36 weeks) and a Hx of postpartum preeclampsia  Baseline pre-e labs/urine, Start ASA 162 mg daily    09/11/2018: 24 hr urine protein 158             History of placenta abruption ICD-10-CM: Z87.59  ICD-9-CM: V13.29               Problem List Items Addressed This Visit        Respiratory    Asthma during pregnancy     noted            Other    History of pre-eclampsia in prior pregnancy, currently pregnant in third trimester     BP nl  Denies HA, vision changes, RUQ/epigastric pain, N/V or swelling.  Continue ASA         Hypokalemia - Primary     Multigravida in third trimester     PTL/labor precautions, FMC, and pregnancy warning signs reviewed.  RTO 2 weeks OBV with [place]         Relevant Orders    GLUCOSE, GESTATIONAL 1 HR TOLERANCE    CBC W/O DIFF    METABOLIC PANEL, COMPREHENSIVE          Orders Placed This Encounter   ??? Glucose, Gestational 1 Hour Tolerance   ??? CBC w/o Diff   ??? CMP       Outpatient Encounter Medications as of 12/17/2018   Medication Sig Dispense Refill   ??? PNV no.153/FA/om3/dha/epa/fish (PRENATAL GUMMIES PO) Take  by mouth.     ??? aspirin delayed-release 81 mg tablet Take 2 Tabs by mouth daily. 60 Tab 11     No facility-administered encounter medications on file as of 12/17/2018.                Labor signs, pregnancy warning signs, and fetal movement counting reviewed (if applicable)        Vernell Barrier, NP 12/17/18 9:04 AM

## 2018-12-17 NOTE — Progress Notes (Signed)
 Patient comes in today for routine prenatal visit. No complaints/concerns today.    Finished Glucola @ 8:58 am    Fetal Movements:  Yes  Contractions:  No  Vaginal Bleeding:  No  Leaking Fluid:  No  GI/GU issues:  No    Visit Vitals  BP 112/70 (BP 1 Location: Left arm, BP Patient Position: Sitting)   Temp 97.9 F (36.6 C) (Temporal)   Ht 5' (1.524 m)   Wt 188 lb (85.3 kg)   Breastfeeding No   BMI 36.72 kg/m         Missie L Cantrell  12/17/18  8:50 AM

## 2018-12-17 NOTE — Progress Notes (Signed)
This is a 28 y.o.  G6 P2123 at [redacted]w[redacted]d for routine OB visit.    Her Estimated Due Date is 02/23/2019, by Last Menstrual Period    Denies leaking of fluid, vaginal bleeding, or regular contractions. Reports fetal movement. Denies severe or persistent HA, vision changes, RUQ/epigastric pain, N/V or swelling.      Current Outpatient Medications on File Prior to Visit   Medication Sig Dispense Refill   ??? PNV no.153/FA/om3/dha/epa/fish (PRENATAL GUMMIES PO) Take  by mouth.     ??? aspirin delayed-release 81 mg tablet Take 2 Tabs by mouth daily. 60 Tab 11     No current facility-administered medications on file prior to visit.        No Known Allergies    OB History   Gravida Para Term Preterm AB Living   6 3 2 1 2 3    SAB TAB Ectopic Molar Multiple Live Births   0 0 0 0 0 3       # 1 - Date: None, Sex: None, Weight: None, GA: None, Delivery: None, Apgar1: None, Apgar5: None, Living: None, Birth Comments: None    # 2 - Date: None, Sex: None, Weight: None, GA: None, Delivery: None, Apgar1: None, Apgar5: None, Living: None, Birth Comments: None    # 3 - Date: 2012, Sex: Female, Weight: 5 lb 9 oz (2.523 kg), GA: [redacted]w[redacted]d, Delivery: Vaginal, Spontaneous, Apgar1: None, Apgar5: None, Living: Living, Birth Comments: None    # 4 - Date: 95, Sex: None, Weight: 6 lb (2.722 kg), GA: [redacted]w[redacted]d, Delivery: Vaginal, Spontaneous, Apgar1: None, Apgar5: None, Living: Living, Birth Comments: None    # 5 - Date: 2017, Sex: None, Weight: 7 lb (3.175 kg), GA: [redacted]w[redacted]d, Delivery: Vaginal, Spontaneous, Apgar1: None, Apgar5: None, Living: Living, Birth Comments: None    # 6 - Date: None, Sex: None, Weight: None, GA: None, Delivery: None, Apgar1: None, Apgar5: None, Living: None, Birth Comments: None        Past Medical History:   Diagnosis Date   ??? Asthma    ??? History of placental abruption    ??? Pre-eclampsia in postpartum period    ??? Preeclampsia    ??? Umbilical hernia        Past Surgical History:   Procedure Laterality Date   ??? HX TONSILLECTOMY         Family  History   Problem Relation Age of Onset   ??? Breast Cancer Paternal Aunt    ??? Ovarian Cancer Neg Hx    ??? Uterine Cancer Neg Hx    ??? Colon Cancer Neg Hx        Social History     Socioeconomic History   ??? Marital status: MARRIED     Spouse name: Not on file   ??? Number of children: Not on file   ??? Years of education: Not on file   ??? Highest education level: Not on file   Occupational History   ??? Not on file   Social Needs   ??? Financial resource strain: Not on file   ??? Food insecurity     Worry: Not on file     Inability: Not on file   ??? Transportation needs     Medical: Not on file     Non-medical: Not on file   Tobacco Use   ??? Smoking status: Never Smoker   ??? Smokeless tobacco: Never Used   Substance and Sexual Activity   ??? Alcohol use: Not Currently   ???  Drug use: Never   ??? Sexual activity: Yes   Lifestyle   ??? Physical activity     Days per week: Not on file     Minutes per session: Not on file   ??? Stress: Not on file   Relationships   ??? Social Wellsite geologistconnections     Talks on phone: Not on file     Gets together: Not on file     Attends religious service: Not on file     Active member of club or organization: Not on file     Attends meetings of clubs or organizations: Not on file     Relationship status: Not on file   ??? Intimate partner violence     Fear of current or ex partner: Not on file     Emotionally abused: Not on file     Physically abused: Not on file     Forced sexual activity: Not on file   Other Topics Concern   ??? Military Service Not Asked   ??? Blood Transfusions Not Asked   ??? Caffeine Concern No   ??? Occupational Exposure Not Asked   ??? Hobby Hazards Not Asked   ??? Sleep Concern Not Asked   ??? Stress Concern Not Asked   ??? Weight Concern Not Asked   ??? Special Diet Not Asked   ??? Back Care Not Asked   ??? Exercise No   ??? Bike Helmet Not Asked   ??? Seat Belt Yes   ??? Self-Exams Yes   Social History Narrative    Abuse: Feels safe at home, no history of physical abuse, no history of sexual abuse           Objective    Visit  Vitals  BP 112/70 (BP 1 Location: Left arm, BP Patient Position: Sitting)   Temp 97.9 ??F (36.6 ??C) (Temporal)   Ht 5' (1.524 m)   Wt 188 lb (85.3 kg)   Breastfeeding No   BMI 36.72 kg/m??       General: well developed, well nourished, in no acute distress    Head: normocephalic and atraumatic    Resp: even and unlabored    Psych: Normal mood and affect        Assessment and Plan      Problem List  Never Reviewed          Codes Class    Hypokalemia ICD-10-CM: E87.6  ICD-9-CM: 276.8     Overview Signed 11/06/2018  8:26 AM by Vernell Barrieraylor, Criselda Starke L, NP     10/28/2018: K+ 2.8 mEq in ER, received 40 mEQ IV             Heart palpitations ICD-10-CM: R00.2  ICD-9-CM: 785.1         Multigravida in third trimester ICD-10-CM: Z34.83  ICD-9-CM: V22.1     Overview Addendum 12/17/2018  9:04 AM by Vernell Barrieraylor, Slayter Moorhouse L, NP     EDD 02/23/2019 by LMP c/w 16wk US    09/10/2018: AFP, CF, SMA negative  12/17/2018: Flu vaccine/TDAP recommended, Plans breastfeeding, desires BTL             Asthma during pregnancy ICD-10-CM: O99.519, J45.909  ICD-9-CM: 648.93, 493.90     Overview Signed 09/09/2018  3:22 PM by Vernell Barrieraylor, Charniece Venturino L, NP     Rare inhaler use (1-2/year)             History of pre-eclampsia in prior pregnancy, currently pregnant in third trimester ICD-10-CM: O09.293  ICD-9-CM: V23.49  Overview Addendum 09/13/2018  7:32 AM by Vernell Barrier, NP     Hx of preeclampsia (requiring delivery at 36 weeks) and a Hx of postpartum preeclampsia  Baseline pre-e labs/urine, Start ASA 162 mg daily    09/11/2018: 24 hr urine protein 158             History of placenta abruption ICD-10-CM: Z87.59  ICD-9-CM: V13.29               Problem List Items Addressed This Visit        Respiratory    Asthma during pregnancy     noted            Other    History of pre-eclampsia in prior pregnancy, currently pregnant in third trimester     BP nl  Denies HA, vision changes, RUQ/epigastric pain, N/V or swelling.  Continue ASA         Hypokalemia - Primary    Multigravida in third  trimester     PTL/labor precautions, FMC, and pregnancy warning signs reviewed.  RTO 2 weeks OBV with [place]         Relevant Orders    GLUCOSE, GESTATIONAL 1 HR TOLERANCE    CBC W/O DIFF    METABOLIC PANEL, COMPREHENSIVE          Orders Placed This Encounter   ??? Glucose, Gestational 1 Hour Tolerance   ??? CBC w/o Diff   ??? CMP       Outpatient Encounter Medications as of 12/17/2018   Medication Sig Dispense Refill   ??? PNV no.153/FA/om3/dha/epa/fish (PRENATAL GUMMIES PO) Take  by mouth.     ??? aspirin delayed-release 81 mg tablet Take 2 Tabs by mouth daily. 60 Tab 11     No facility-administered encounter medications on file as of 12/17/2018.                Labor signs, pregnancy warning signs, and fetal movement counting reviewed (if applicable)        Vernell Barrier, NP 12/17/18 9:04 AM

## 2018-12-17 NOTE — Assessment & Plan Note (Signed)
PTL/labor precautions, FMC, and pregnancy warning signs reviewed.  RTO 2 weeks OBV with [place]

## 2018-12-18 ENCOUNTER — Telehealth

## 2018-12-18 LAB — COMPREHENSIVE METABOLIC PANEL
ALT: 14 IU/L (ref 0–32)
AST: 20 IU/L (ref 0–40)
Albumin/Globulin Ratio: 1.7 NA (ref 1.2–2.2)
Albumin: 3.7 g/dL — ABNORMAL LOW (ref 3.9–5.0)
Alkaline Phosphatase: 136 IU/L — ABNORMAL HIGH (ref 39–117)
BUN: 4 mg/dL — ABNORMAL LOW (ref 6–20)
Bun/Cre Ratio: 11 NA (ref 9–23)
CO2: 21 mmol/L (ref 20–29)
Calcium: 9 mg/dL (ref 8.7–10.2)
Chloride: 107 mmol/L — ABNORMAL HIGH (ref 96–106)
Creatinine: 0.37 mg/dL — ABNORMAL LOW (ref 0.57–1.00)
EGFR IF NonAfrican American: 145 mL/min/{1.73_m2} (ref >59)
GFR African American: 167 mL/min/{1.73_m2} (ref >59)
Globulin, Total: 2.2 g/dL (ref 1.5–4.5)
Glucose: 161 mg/dL — ABNORMAL HIGH (ref 65–99)
Potassium: 3.6 mmol/L (ref 3.5–5.2)
Sodium: 143 mmol/L (ref 134–144)
Total Bilirubin: 0.6 mg/dL (ref 0.0–1.2)
Total Protein: 5.9 g/dL — ABNORMAL LOW (ref 6.0–8.5)

## 2018-12-18 LAB — CBC
Hematocrit: 33.2 % — ABNORMAL LOW (ref 34.0–46.6)
Hemoglobin: 11 g/dL — ABNORMAL LOW (ref 11.1–15.9)
MCH: 26.7 pg (ref 26.6–33.0)
MCHC: 33.1 g/dL (ref 31.5–35.7)
MCV: 81 fL (ref 79–97)
Platelets: 218 10*3/uL (ref 150–450)
RBC: 4.12 x10E6/uL (ref 3.77–5.28)
RDW: 12.9 % (ref 11.7–15.4)
WBC: 9.6 10*3/uL (ref 3.4–10.8)

## 2018-12-18 LAB — GLUCOSE, GESTATIONAL 1 HR TOLERANCE
Gestational Diabetes Screen: 158 mg/dL — ABNORMAL HIGH (ref 65–139)
Gestational Diabetes Screen: 158 mg/dL — ABNORMAL HIGH (ref 65–139)

## 2018-12-18 LAB — METABOLIC PANEL, COMPREHENSIVE
A-G Ratio: 1.7 (ref 1.2–2.2)
ALT (SGPT): 14 IU/L (ref 0–32)
AST (SGOT): 20 IU/L (ref 0–40)
Albumin: 3.7 g/dL — ABNORMAL LOW (ref 3.9–5.0)
Alk. phosphatase: 136 IU/L — ABNORMAL HIGH (ref 39–117)
BUN/Creatinine ratio: 11 (ref 9–23)
BUN: 4 mg/dL — ABNORMAL LOW (ref 6–20)
Bilirubin, total: 0.6 mg/dL (ref 0.0–1.2)
CO2: 21 mmol/L (ref 20–29)
Calcium: 9 mg/dL (ref 8.7–10.2)
Chloride: 107 mmol/L — ABNORMAL HIGH (ref 96–106)
Creatinine: 0.37 mg/dL — ABNORMAL LOW (ref 0.57–1.00)
GFR est AA: 167 mL/min/{1.73_m2} (ref >59)
GFR est non-AA: 145 mL/min/{1.73_m2} (ref >59)
GLOBULIN, TOTAL: 2.2 g/dL (ref 1.5–4.5)
Glucose: 161 mg/dL — ABNORMAL HIGH (ref 65–99)
Potassium: 3.6 mmol/L (ref 3.5–5.2)
Protein, total: 5.9 g/dL — ABNORMAL LOW (ref 6.0–8.5)
Sodium: 143 mmol/L (ref 134–144)

## 2018-12-18 LAB — CBC W/O DIFF
HCT: 33.2 % — ABNORMAL LOW (ref 34.0–46.6)
HGB: 11 g/dL — ABNORMAL LOW (ref 11.1–15.9)
MCH: 26.7 pg (ref 26.6–33.0)
MCHC: 33.1 g/dL (ref 31.5–35.7)
MCV: 81 fL (ref 79–97)
PLATELET: 218 10*3/uL (ref 150–450)
RBC: 4.12 x10E6/uL (ref 3.77–5.28)
RDW: 12.9 % (ref 11.7–15.4)
WBC: 9.6 10*3/uL (ref 3.4–10.8)

## 2018-12-18 NOTE — Telephone Encounter (Signed)
Patient has failed 1 hr Glucola. Needs to be scheduled for 3hr GTT.

## 2018-12-19 NOTE — Telephone Encounter (Signed)
Patient scheduled for next Friday when she has an OB appointment. Due to transportation it is easier for her to come on the same day. She will be emailed the carbohydrate diet to start on Tuesday and she understands on Thursday after midnight she is not to have anything to eat or drink. She verbalizes understanding of why she is coming in for her labs.

## 2018-12-26 ENCOUNTER — Encounter

## 2018-12-27 ENCOUNTER — Ambulatory Visit: Attending: Women's Health

## 2018-12-27 ENCOUNTER — Encounter: Admit: 2018-12-27 | Discharge: 2018-12-27 | Payer: BLUE CROSS/BLUE SHIELD

## 2018-12-27 ENCOUNTER — Ambulatory Visit: Admit: 2018-12-27 | Discharge: 2018-12-27 | Payer: BLUE CROSS/BLUE SHIELD | Attending: Women's Health

## 2018-12-27 DIAGNOSIS — O09293 Supervision of pregnancy with other poor reproductive or obstetric history, third trimester: Secondary | ICD-10-CM

## 2018-12-27 NOTE — Assessment & Plan Note (Signed)
noted 

## 2018-12-27 NOTE — Patient Instructions (Signed)
Thank you for coming.  Return to the office in 2 weeks.  Please call if you have any abdominal pain and 5 contractions in 1 hour, vaginal bleeding or spotting, or leaking of fluid.  You should feel the baby move 10 times in an hour. Monitor this once a day. If you do not feel the baby move this often please call.  Please call immediately if you have a headache that won't go away with tylenol, changes to your vision like funny lights or blurriness, nausea or vomiting, upper abdominal pain, or if the baby does not move at least 10 times in 2 hours.

## 2018-12-27 NOTE — Progress Notes (Signed)
This is a 28 y.o.  G6 P2123 at [redacted]w[redacted]d for routine OB visit.    Her Estimated Due Date is 02/23/2019, by Last Menstrual Period    Denies leaking of fluid, vaginal bleeding, or regular contractions. Reports fetal movement. Denies severe or persistent HA, vision changes, RUQ/epigastric pain, N/V or swelling.      Current Outpatient Medications on File Prior to Visit   Medication Sig Dispense Refill   ??? PNV no.153/FA/om3/dha/epa/fish (PRENATAL GUMMIES PO) Take  by mouth.     ??? aspirin delayed-release 81 mg tablet Take 2 Tabs by mouth daily. 60 Tab 11     No current facility-administered medications on file prior to visit.        No Known Allergies    OB History   Gravida Para Term Preterm AB Living   6 3 2 1 2 3    SAB TAB Ectopic Molar Multiple Live Births   0 0 0 0 0 3       # 1 - Date: None, Sex: None, Weight: None, GA: None, Delivery: None, Apgar1: None, Apgar5: None, Living: None, Birth Comments: None    # 2 - Date: None, Sex: None, Weight: None, GA: None, Delivery: None, Apgar1: None, Apgar5: None, Living: None, Birth Comments: None    # 3 - Date: 2012, Sex: Female, Weight: 5 lb 9 oz (2.523 kg), GA: [redacted]w[redacted]d, Delivery: Vaginal, Spontaneous, Apgar1: None, Apgar5: None, Living: Living, Birth Comments: None    # 4 - Date: 13, Sex: None, Weight: 6 lb (2.722 kg), GA: [redacted]w[redacted]d, Delivery: Vaginal, Spontaneous, Apgar1: None, Apgar5: None, Living: Living, Birth Comments: None    # 5 - Date: 2017, Sex: None, Weight: 7 lb (3.175 kg), GA: [redacted]w[redacted]d, Delivery: Vaginal, Spontaneous, Apgar1: None, Apgar5: None, Living: Living, Birth Comments: None    # 6 - Date: None, Sex: None, Weight: None, GA: None, Delivery: None, Apgar1: None, Apgar5: None, Living: None, Birth Comments: None        Past Medical History:   Diagnosis Date   ??? Asthma    ??? History of placental abruption    ??? Pre-eclampsia in postpartum period    ??? Preeclampsia    ??? Umbilical hernia        Past Surgical History:   Procedure Laterality Date   ??? HX TONSILLECTOMY          Family History   Problem Relation Age of Onset   ??? Breast Cancer Paternal Aunt    ??? Ovarian Cancer Neg Hx    ??? Uterine Cancer Neg Hx    ??? Colon Cancer Neg Hx        Social History     Socioeconomic History   ??? Marital status: MARRIED     Spouse name: Not on file   ??? Number of children: Not on file   ??? Years of education: Not on file   ??? Highest education level: Not on file   Occupational History   ??? Not on file   Social Needs   ??? Financial resource strain: Not on file   ??? Food insecurity     Worry: Not on file     Inability: Not on file   ??? Transportation needs     Medical: Not on file     Non-medical: Not on file   Tobacco Use   ??? Smoking status: Never Smoker   ??? Smokeless tobacco: Never Used   Substance and Sexual Activity   ??? Alcohol use: Not Currently   ???  Drug use: Never   ??? Sexual activity: Yes   Lifestyle   ??? Physical activity     Days per week: Not on file     Minutes per session: Not on file   ??? Stress: Not on file   Relationships   ??? Social Product manager on phone: Not on file     Gets together: Not on file     Attends religious service: Not on file     Active member of club or organization: Not on file     Attends meetings of clubs or organizations: Not on file     Relationship status: Not on file   ??? Intimate partner violence     Fear of current or ex partner: Not on file     Emotionally abused: Not on file     Physically abused: Not on file     Forced sexual activity: Not on file   Other Topics Concern   ??? Military Service Not Asked   ??? Blood Transfusions Not Asked   ??? Caffeine Concern No   ??? Occupational Exposure Not Asked   ??? Hobby Hazards Not Asked   ??? Sleep Concern Not Asked   ??? Stress Concern Not Asked   ??? Weight Concern Not Asked   ??? Special Diet Not Asked   ??? Back Care Not Asked   ??? Exercise No   ??? Bike Helmet Not Asked   ??? Seat Belt Yes   ??? Self-Exams Yes   Social History Narrative    Abuse: Feels safe at home, no history of physical abuse, no history of sexual abuse           Objective     Visit Vitals  BP 132/74 (BP 1 Location: Left arm, BP Patient Position: Sitting)   Wt 187 lb (84.8 kg)   BMI 36.52 kg/m??       General: well developed, well nourished, in no acute distress    Head: normocephalic and atraumatic    Resp: even and unlabored    Psych: Normal mood and affect        Assessment and Plan      Problem List  Never Reviewed          Codes Class    Pruritus ICD-10-CM: L29.9  ICD-9-CM: 698.9         Abnormal glucose affecting pregnancy ICD-10-CM: O99.810  ICD-9-CM: 648.80     Overview Signed 12/18/2018 11:04 AM by Marcelino Freestone, NP     Failed 1 hr, 3hr GTT pending             Hypokalemia ICD-10-CM: E87.6  ICD-9-CM: 276.8     Overview Addendum 12/18/2018 11:03 AM by Marcelino Freestone, NP     10/28/2018: K+ 2.8 mEq in ER, received 40 mEQ IV    12/18/2018: K+ 3.6             Heart palpitations ICD-10-CM: R00.2  ICD-9-CM: 785.1         Multigravida in third trimester ICD-10-CM: Z34.83  ICD-9-CM: V22.1     Overview Addendum 12/17/2018  9:04 AM by Marcelino Freestone, NP     EDD 02/23/2019 by LMP c/w 16wk [place]    09/10/2018: AFP, CF, SMA negative  12/17/2018: Flu vaccine/TDAP recommended, Plans breastfeeding, desires BTL             Asthma during pregnancy ICD-10-CM: O99.519, J45.909  ICD-9-CM: 648.93, 493.90     Overview  Signed 09/09/2018  3:22 PM by Marcelino Freestone, NP     Rare inhaler use (1-2/year)             History of pre-eclampsia in prior pregnancy, currently pregnant in third trimester ICD-10-CM: O09.293  ICD-9-CM: V23.49     Overview Addendum 09/13/2018  7:32 AM by Marcelino Freestone, NP     Hx of preeclampsia (requiring delivery at 36 weeks) and a Hx of postpartum preeclampsia  Baseline pre-e labs/urine, Start ASA 162 mg daily    09/11/2018: 24 hr urine protein 158             History of placenta abruption ICD-10-CM: Z87.59  ICD-9-CM: V13.29               Problem List Items Addressed This Visit        Integumentary    Pruritus     No rash,  No hx of derm condition ie eczema  Will check bile acid salts,CMP today             Respiratory    Asthma during pregnancy     noted         Relevant Orders    AMB POC US OB RE-EVAL/FOLLOW UP (Completed)       Other    Abnormal glucose affecting pregnancy     3hr today         Relevant Orders    AMB POC US OB RE-EVAL/FOLLOW UP (Completed)    History of placenta abruption     noted         Relevant Orders    AMB POC US OB RE-EVAL/FOLLOW UP (Completed)    History of pre-eclampsia in prior pregnancy, currently pregnant in third trimester - Primary     BP nl         Relevant Orders    AMB POC US OB RE-EVAL/FOLLOW UP (Completed)    Multigravida in third trimester     PTL/labor precautions, Smoaks, and pregnancy warning signs reviewed.  RTO 2 weeks         Relevant Orders    AMB POC US OB RE-EVAL/FOLLOW UP (Completed)    BILE ACIDS, TOTAL    METABOLIC PANEL, COMPREHENSIVE          Orders Placed This Encounter   ??? AMB POC US OB RE-EVAL/FOLLOW UP   ??? BILE ACIDS, TOTAL   ??? CMP       Outpatient Encounter Medications as of 12/27/2018   Medication Sig Dispense Refill   ??? PNV no.153/FA/om3/dha/epa/fish (PRENATAL GUMMIES PO) Take  by mouth.     ??? aspirin delayed-release 81 mg tablet Take 2 Tabs by mouth daily. 60 Tab 11     No facility-administered encounter medications on file as of 12/27/2018.                Labor signs, pregnancy warning signs, and fetal movement counting reviewed (if applicable)        Marcelino Freestone, NP 12/27/18 8:52 AM

## 2018-12-27 NOTE — Assessment & Plan Note (Signed)
No rash,  No hx of derm condition ie eczema  Will check bile acid salts,CMP today

## 2018-12-27 NOTE — Assessment & Plan Note (Signed)
PTL/labor precautions, FMC, and pregnancy warning signs reviewed.  RTO 2 weeks

## 2018-12-27 NOTE — Assessment & Plan Note (Signed)
BP nl

## 2018-12-27 NOTE — Assessment & Plan Note (Signed)
3hr today

## 2018-12-27 NOTE — Progress Notes (Signed)
This is a 28 y.o.  G6 P2123 at [redacted]w[redacted]d for routine OB visit.    Her Estimated Due Date is 02/23/2019, by Last Menstrual Period    Denies leaking of fluid, vaginal bleeding, or regular contractions. Reports fetal movement. Denies severe or persistent HA, vision changes, RUQ/epigastric pain, N/V or swelling.      Current Outpatient Medications on File Prior to Visit   Medication Sig Dispense Refill   ??? PNV no.153/FA/om3/dha/epa/fish (PRENATAL GUMMIES PO) Take  by mouth.     ??? aspirin delayed-release 81 mg tablet Take 2 Tabs by mouth daily. 60 Tab 11     No current facility-administered medications on file prior to visit.        No Known Allergies    OB History   Gravida Para Term Preterm AB Living   6 3 2 1 2 3    SAB TAB Ectopic Molar Multiple Live Births   0 0 0 0 0 3       # 1 - Date: None, Sex: None, Weight: None, GA: None, Delivery: None, Apgar1: None, Apgar5: None, Living: None, Birth Comments: None    # 2 - Date: None, Sex: None, Weight: None, GA: None, Delivery: None, Apgar1: None, Apgar5: None, Living: None, Birth Comments: None    # 3 - Date: 2012, Sex: Female, Weight: 5 lb 9 oz (2.523 kg), GA: [redacted]w[redacted]d, Delivery: Vaginal, Spontaneous, Apgar1: None, Apgar5: None, Living: Living, Birth Comments: None    # 4 - Date: 53, Sex: None, Weight: 6 lb (2.722 kg), GA: [redacted]w[redacted]d, Delivery: Vaginal, Spontaneous, Apgar1: None, Apgar5: None, Living: Living, Birth Comments: None    # 5 - Date: 2017, Sex: None, Weight: 7 lb (3.175 kg), GA: [redacted]w[redacted]d, Delivery: Vaginal, Spontaneous, Apgar1: None, Apgar5: None, Living: Living, Birth Comments: None    # 6 - Date: None, Sex: None, Weight: None, GA: None, Delivery: None, Apgar1: None, Apgar5: None, Living: None, Birth Comments: None        Past Medical History:   Diagnosis Date   ??? Asthma    ??? History of placental abruption    ??? Pre-eclampsia in postpartum period    ??? Preeclampsia    ??? Umbilical hernia        Past Surgical History:   Procedure Laterality Date   ??? HX TONSILLECTOMY         Family  History   Problem Relation Age of Onset   ??? Breast Cancer Paternal Aunt    ??? Ovarian Cancer Neg Hx    ??? Uterine Cancer Neg Hx    ??? Colon Cancer Neg Hx        Social History     Socioeconomic History   ??? Marital status: MARRIED     Spouse name: Not on file   ??? Number of children: Not on file   ??? Years of education: Not on file   ??? Highest education level: Not on file   Occupational History   ??? Not on file   Social Needs   ??? Financial resource strain: Not on file   ??? Food insecurity     Worry: Not on file     Inability: Not on file   ??? Transportation needs     Medical: Not on file     Non-medical: Not on file   Tobacco Use   ??? Smoking status: Never Smoker   ??? Smokeless tobacco: Never Used   Substance and Sexual Activity   ??? Alcohol use: Not Currently   ???  Drug use: Never   ??? Sexual activity: Yes   Lifestyle   ??? Physical activity     Days per week: Not on file     Minutes per session: Not on file   ??? Stress: Not on file   Relationships   ??? Social Wellsite geologistconnections     Talks on phone: Not on file     Gets together: Not on file     Attends religious service: Not on file     Active member of club or organization: Not on file     Attends meetings of clubs or organizations: Not on file     Relationship status: Not on file   ??? Intimate partner violence     Fear of current or ex partner: Not on file     Emotionally abused: Not on file     Physically abused: Not on file     Forced sexual activity: Not on file   Other Topics Concern   ??? Military Service Not Asked   ??? Blood Transfusions Not Asked   ??? Caffeine Concern No   ??? Occupational Exposure Not Asked   ??? Hobby Hazards Not Asked   ??? Sleep Concern Not Asked   ??? Stress Concern Not Asked   ??? Weight Concern Not Asked   ??? Special Diet Not Asked   ??? Back Care Not Asked   ??? Exercise No   ??? Bike Helmet Not Asked   ??? Seat Belt Yes   ??? Self-Exams Yes   Social History Narrative    Abuse: Feels safe at home, no history of physical abuse, no history of sexual abuse           Objective    Visit  Vitals  BP 132/74 (BP 1 Location: Left arm, BP Patient Position: Sitting)   Wt 187 lb (84.8 kg)   BMI 36.52 kg/m??       General: well developed, well nourished, in no acute distress    Head: normocephalic and atraumatic    Resp: even and unlabored    Psych: Normal mood and affect        Assessment and Plan      Problem List  Never Reviewed          Codes Class    Pruritus ICD-10-CM: L29.9  ICD-9-CM: 698.9         Abnormal glucose affecting pregnancy ICD-10-CM: O99.810  ICD-9-CM: 648.80     Overview Signed 12/18/2018 11:04 AM by Vernell Barrieraylor, Sayan Aldava L, NP     Failed 1 hr, 3hr GTT pending             Hypokalemia ICD-10-CM: E87.6  ICD-9-CM: 276.8     Overview Addendum 12/18/2018 11:03 AM by Vernell Barrieraylor, Chinmay Squier L, NP     10/28/2018: K+ 2.8 mEq in ER, received 40 mEQ IV    12/18/2018: K+ 3.6             Heart palpitations ICD-10-CM: R00.2  ICD-9-CM: 785.1         Multigravida in third trimester ICD-10-CM: Z34.83  ICD-9-CM: V22.1     Overview Addendum 12/17/2018  9:04 AM by Vernell Barrieraylor, Santresa Levett L, NP     EDD 02/23/2019 by LMP c/w 16wk US    09/10/2018: AFP, CF, SMA negative  12/17/2018: Flu vaccine/TDAP recommended, Plans breastfeeding, desires BTL             Asthma during pregnancy ICD-10-CM: O99.519, J45.909  ICD-9-CM: 648.93, 493.90     Overview  Signed 09/09/2018  3:22 PM by Marcelino Freestone, NP     Rare inhaler use (1-2/year)             History of pre-eclampsia in prior pregnancy, currently pregnant in third trimester ICD-10-CM: O09.293  ICD-9-CM: V23.49     Overview Addendum 09/13/2018  7:32 AM by Marcelino Freestone, NP     Hx of preeclampsia (requiring delivery at 36 weeks) and a Hx of postpartum preeclampsia  Baseline pre-e labs/urine, Start ASA 162 mg daily    09/11/2018: 24 hr urine protein 158             History of placenta abruption ICD-10-CM: Z87.59  ICD-9-CM: V13.29               Problem List Items Addressed This Visit        Integumentary    Pruritus     No rash,  No hx of derm condition ie eczema  Will check bile acid salts,CMP today             Respiratory    Asthma during pregnancy     noted         Relevant Orders    AMB POC US OB RE-EVAL/FOLLOW UP (Completed)       Other    Abnormal glucose affecting pregnancy     3hr today         Relevant Orders    AMB POC US OB RE-EVAL/FOLLOW UP (Completed)    History of placenta abruption     noted         Relevant Orders    AMB POC US OB RE-EVAL/FOLLOW UP (Completed)    History of pre-eclampsia in prior pregnancy, currently pregnant in third trimester - Primary     BP nl         Relevant Orders    AMB POC US OB RE-EVAL/FOLLOW UP (Completed)    Multigravida in third trimester     PTL/labor precautions, Smoaks, and pregnancy warning signs reviewed.  RTO 2 weeks         Relevant Orders    AMB POC US OB RE-EVAL/FOLLOW UP (Completed)    BILE ACIDS, TOTAL    METABOLIC PANEL, COMPREHENSIVE          Orders Placed This Encounter   ??? AMB POC US OB RE-EVAL/FOLLOW UP   ??? BILE ACIDS, TOTAL   ??? CMP       Outpatient Encounter Medications as of 12/27/2018   Medication Sig Dispense Refill   ??? PNV no.153/FA/om3/dha/epa/fish (PRENATAL GUMMIES PO) Take  by mouth.     ??? aspirin delayed-release 81 mg tablet Take 2 Tabs by mouth daily. 60 Tab 11     No facility-administered encounter medications on file as of 12/27/2018.                Labor signs, pregnancy warning signs, and fetal movement counting reviewed (if applicable)        Marcelino Freestone, NP 12/27/18 8:52 AM

## 2018-12-27 NOTE — Assessment & Plan Note (Signed)
No rash,  No hx of derm condition ie eczema  Will check bile acid salts,CMP today

## 2018-12-28 LAB — GLUCOSE, GESTATIONAL, 3 HR TOLERANCE
GLUCOSE - 1 HOUR, 102020: 179 mg/dL (ref 65–179)
GLUCOSE - 2 HOUR, 102038: 141 mg/dL (ref 65–154)
GLUCOSE - 3 HOUR, 102046: 119 mg/dL (ref 65–139)
GLUCOSE - FASTING: 81 mg/dL (ref 65–94)
Glucose - 1 hour: 179 mg/dL (ref 65–179)
Glucose - 2 hour: 141 mg/dL (ref 65–154)
Glucose - 3 hour: 119 mg/dL (ref 65–139)
Glucose - Fasting: 81 mg/dL (ref 65–94)

## 2018-12-29 LAB — COMPREHENSIVE METABOLIC PANEL
ALT: 13 IU/L (ref 0–32)
AST: 19 IU/L (ref 0–40)
Albumin/Globulin Ratio: 1.9 NA (ref 1.2–2.2)
Albumin: 3.7 g/dL — ABNORMAL LOW (ref 3.9–5.0)
Alkaline Phosphatase: 151 IU/L — ABNORMAL HIGH (ref 39–117)
BUN: 5 mg/dL — ABNORMAL LOW (ref 6–20)
Bun/Cre Ratio: 13 NA (ref 9–23)
CO2: 21 mmol/L (ref 20–29)
Calcium: 9.2 mg/dL (ref 8.7–10.2)
Chloride: 107 mmol/L — ABNORMAL HIGH (ref 96–106)
Creatinine: 0.4 mg/dL — ABNORMAL LOW (ref 0.57–1.00)
EGFR IF NonAfrican American: 141 mL/min/{1.73_m2} (ref >59)
GFR African American: 163 mL/min/{1.73_m2} (ref >59)
Globulin, Total: 2 g/dL (ref 1.5–4.5)
Glucose: 174 mg/dL — ABNORMAL HIGH (ref 65–99)
Potassium: 3.5 mmol/L (ref 3.5–5.2)
Sodium: 143 mmol/L (ref 134–144)
Total Bilirubin: 0.5 mg/dL (ref 0.0–1.2)
Total Protein: 5.7 g/dL — ABNORMAL LOW (ref 6.0–8.5)

## 2018-12-29 LAB — METABOLIC PANEL, COMPREHENSIVE
A-G Ratio: 1.9 (ref 1.2–2.2)
ALT (SGPT): 13 IU/L (ref 0–32)
AST (SGOT): 19 IU/L (ref 0–40)
Albumin: 3.7 g/dL — ABNORMAL LOW (ref 3.9–5.0)
Alk. phosphatase: 151 IU/L — ABNORMAL HIGH (ref 39–117)
BUN/Creatinine ratio: 13 (ref 9–23)
BUN: 5 mg/dL — ABNORMAL LOW (ref 6–20)
Bilirubin, total: 0.5 mg/dL (ref 0.0–1.2)
CO2: 21 mmol/L (ref 20–29)
Calcium: 9.2 mg/dL (ref 8.7–10.2)
Chloride: 107 mmol/L — ABNORMAL HIGH (ref 96–106)
Creatinine: 0.4 mg/dL — ABNORMAL LOW (ref 0.57–1.00)
GFR est AA: 163 mL/min/{1.73_m2} (ref >59)
GFR est non-AA: 141 mL/min/{1.73_m2} (ref >59)
GLOBULIN, TOTAL: 2 g/dL (ref 1.5–4.5)
Glucose: 174 mg/dL — ABNORMAL HIGH (ref 65–99)
Potassium: 3.5 mmol/L (ref 3.5–5.2)
Protein, total: 5.7 g/dL — ABNORMAL LOW (ref 6.0–8.5)
Sodium: 143 mmol/L (ref 134–144)

## 2018-12-29 LAB — BILE ACIDS, TOTAL: Bile Acids: 7.4 umol/L (ref 0.0–10.0)

## 2018-12-30 ENCOUNTER — Telehealth

## 2018-12-30 NOTE — Telephone Encounter (Signed)
Patient notified via mychart

## 2018-12-30 NOTE — Telephone Encounter (Signed)
Please let tp know she passed her 3hr GTT. Also her labs that we did to check her liver function/bile acid were all normal.

## 2019-01-01 ENCOUNTER — Encounter: Attending: Women's Health

## 2019-01-01 DIAGNOSIS — O4703 False labor before 37 completed weeks of gestation, third trimester: Secondary | ICD-10-CM

## 2019-01-01 NOTE — H&P (Addendum)
History & Physical    Name: Debra Warren MRN: [id number]  SSN: [id number]    Date of Birth: 06/12/1990  Age: 28 y.o.  Sex: female        Subjective: Preterm labor  28 yo [phone] presents at 32+3wks reporting preterm contractions which started "a few days ago" and have increased in intensity over the last 2-3 hours. She denies lof or vb. She reports normal FM. She has a h/o IOL at 36 weeks for preeclampsia with her first child and 2 subsequent term SVDs. Her pregnancy has been c/b hypokalemia x 1 episode treated in the ED and an elevated 1hr gtt with normal 3hr.      Estimated Date of Delivery: 02/23/19  OB History   Gravida Para Term Preterm AB Living   6 3 2 1 2 3    SAB TAB Ectopic Molar Multiple Live Births             3      # Outcome Date GA Lbr Len/2nd Weight Sex Delivery Anes PTL Lv   6 Current            5 Term 2017 [redacted]w[redacted]d  3.175 kg  Vag-Spont  N LIV      Complications: Preeclampsia in postpartum period   4 Term 2015 [redacted]w[redacted]d  2.722 kg  Vag-Spont  N LIV      Complications: Abruptio Placenta   3 Preterm 2012 [redacted]w[redacted]d  2.523 kg M Vag-Spont  N LIV      Complications: Preeclampsia   2 AB            1 AB                Past Medical History:   Diagnosis Date   ??? Asthma    ??? History of placental abruption    ??? Pre-eclampsia in postpartum period    ??? Preeclampsia    ??? Umbilical hernia      Past Surgical History:   Procedure Laterality Date   ??? HX TONSILLECTOMY       Social History     Occupational History   ??? Not on file   Tobacco Use   ??? Smoking status: Never Smoker   ??? Smokeless tobacco: Never Used   Substance and Sexual Activity   ??? Alcohol use: Not Currently   ??? Drug use: Never   ??? Sexual activity: Yes     Family History   Problem Relation Age of Onset   ??? Breast Cancer Paternal Aunt    ??? Ovarian Cancer Neg Hx    ??? Uterine Cancer Neg Hx    ??? Colon Cancer Neg Hx        No Known Allergies  Prior to Admission medications    Medication Sig Start Date End Date Taking? Authorizing Provider    PNV no.153/FA/om3/dha/epa/fish (PRENATAL GUMMIES PO) Take  by mouth.   Yes Provider, Historical   aspirin delayed-release 81 mg tablet Take 2 Tabs by mouth daily. 09/09/18  Yes Marcelino Freestone, NP        Review of Systems: Pertinent items are noted in HPI. 10 point ROS is otherwise negative.    Objective:     Vitals:  Vitals:    01/01/19 2328 01/01/19 2330   BP: 134/89    Pulse: 97    Resp: 20    Weight:  84.8 kg (187 lb)   Height:  5' (1.524 m)        Physical Exam:  Patient without distress.  Abdomen: soft, nontender  Fundus: soft and non tender  Perineum: blood absent, amniotic fluid absent  Cervical Exam: Closed/Thick/High  Lower Extremities:  - Edema 1+  Membranes:  Intact  Fetal Heart Rate: Baseline: 130s per minute  Variability: moderate  Accelerations: yes  Decelerations: variable x 1, shallow  Uterine contractions: regular, every 2-3 minutes    Prenatal Labs:   No results found for: ABORH, RUBELLAEXT, GRBSEXT, HBSAGEXT, HIVEXT, RPREXT, GONNOEXT, CHLAMEXT, ABORHEXT, RUBELLAEXT, GRBSEXT, HBSAGEXT, HIVEXT, RPREXT, GONNOEXT, CHLAMEXT      Assessment/Plan: 28 yo G6P3 at 32+3wks presents with preterm ctxs. Cervical exam and history are reassuring. FHR is category 1.      Active Problems:    Preterm labor in third trimester (01/01/2019)         Plan:   FFN sent  IVF bolus ordered  Procardia 10mg  x 1 ordered    Addendum:   Symptoms improved following IVFs and Procardia  FFN resulted negative  Discharge to home

## 2019-01-01 NOTE — Progress Notes (Signed)
Patient presents to triage from home with complaints of abdominal pain and vaginal pressure for two days.  Denies any bleeding or LOF, reports good fetal movement.

## 2019-01-01 NOTE — Progress Notes (Signed)
Patient presents to triage from home with complaints of abdominal pain and vaginal pressure for two days.  Denies any bleeding or LOF, reports good fetal movement.

## 2019-01-01 NOTE — H&P (Signed)
History & Physical    Name: Debra Warren MRN: [id number]  SSN: [id number]    Date of Birth: 1990-12-11  Age: 27 y.o.  Sex: female        Subjective: Preterm labor  28 yo [phone] presents at 32+3wks reporting preterm contractions which started "a few days ago" and have increased in intensity over the last 2-3 hours. She denies lof or vb. She reports normal FM. She has a h/o IOL at 36 weeks for preeclampsia with her first child and 2 subsequent term SVDs. Her pregnancy has been c/b hypokalemia x 1 episode treated in the ED and an elevated 1hr gtt with normal 3hr.      Estimated Date of Delivery: 02/23/19  OB History   Gravida Para Term Preterm AB Living   6 3 2 1 2 3    SAB TAB Ectopic Molar Multiple Live Births             3      # Outcome Date GA Lbr Len/2nd Weight Sex Delivery Anes PTL Lv   6 Current            5 Term 2017 [redacted]w[redacted]d  3.175 kg  Vag-Spont  N LIV      Complications: Preeclampsia in postpartum period   4 Term 2015 [redacted]w[redacted]d  2.722 kg  Vag-Spont  N LIV      Complications: Abruptio Placenta   3 Preterm 2012 [redacted]w[redacted]d  2.523 kg M Vag-Spont  N LIV      Complications: Preeclampsia   2 AB            1 AB                Past Medical History:   Diagnosis Date   ??? Asthma    ??? History of placental abruption    ??? Pre-eclampsia in postpartum period    ??? Preeclampsia    ??? Umbilical hernia      Past Surgical History:   Procedure Laterality Date   ??? HX TONSILLECTOMY       Social History     Occupational History   ??? Not on file   Tobacco Use   ??? Smoking status: Never Smoker   ??? Smokeless tobacco: Never Used   Substance and Sexual Activity   ??? Alcohol use: Not Currently   ??? Drug use: Never   ??? Sexual activity: Yes     Family History   Problem Relation Age of Onset   ??? Breast Cancer Paternal Aunt    ??? Ovarian Cancer Neg Hx    ??? Uterine Cancer Neg Hx    ??? Colon Cancer Neg Hx        No Known Allergies  Prior to Admission medications    Medication Sig Start Date End Date Taking? Authorizing Provider   PNV no.153/FA/om3/dha/epa/fish  (PRENATAL GUMMIES PO) Take  by mouth.   Yes Provider, Historical   aspirin delayed-release 81 mg tablet Take 2 Tabs by mouth daily. 09/09/18  Yes Marcelino Freestone, NP        Review of Systems: Pertinent items are noted in HPI. 10 point ROS is otherwise negative.    Objective:     Vitals:  Vitals:    01/01/19 2328 01/01/19 2330   BP: 134/89    Pulse: 97    Resp: 20    Weight:  84.8 kg (187 lb)   Height:  5' (1.524 m)        Physical Exam:  Patient without distress.  Abdomen: soft, nontender  Fundus: soft and non tender  Perineum: blood absent, amniotic fluid absent  Cervical Exam: Closed/Thick/High  Lower Extremities:  - Edema 1+  Membranes:  Intact  Fetal Heart Rate: Baseline: 130s per minute  Variability: moderate  Accelerations: yes  Decelerations: variable x 1, shallow  Uterine contractions: regular, every 2-3 minutes    Prenatal Labs:   No results found for: ABORH, RUBELLAEXT, GRBSEXT, HBSAGEXT, HIVEXT, RPREXT, GONNOEXT, CHLAMEXT, ABORHEXT, RUBELLAEXT, GRBSEXT, HBSAGEXT, HIVEXT, RPREXT, GONNOEXT, CHLAMEXT      Assessment/Plan: 28 yo G6P3 at 32+3wks presents with preterm ctxs. Cervical exam and history are reassuring. FHR is category 1.      Active Problems:    Preterm labor in third trimester (01/01/2019)         Plan:   FFN sent  IVF bolus ordered  Procardia 10mg  x 1 ordered    Addendum:   Symptoms improved following IVFs and Procardia  FFN resulted negative  Discharge to home

## 2019-01-02 ENCOUNTER — Inpatient Hospital Stay
Admit: 2019-01-02 | Discharge: 2019-01-02 | Disposition: A | Discharge status: Home / Self Care | Payer: BLUE CROSS/BLUE SHIELD

## 2019-01-02 LAB — FETAL FIBRONECTIN
Fetal Fibronectin: NEGATIVE
Fetal fibronectin: NEGATIVE

## 2019-01-02 LAB — POC URINE DIPSTICK MANUAL
GLUCOSE, GLUUPC: NEGATIVE
Glucose, urine (POC): NEGATIVE
Ketones (POC): 15
Ketones: 15

## 2019-01-02 MED ORDER — NIFEDIPINE 10 MG CAP
10 mg | Freq: Once | ORAL | Status: AC
Start: 2019-01-02 — End: 2019-01-02
  Administered 2019-01-02: 05:00:00 via ORAL

## 2019-01-02 MED ORDER — LACTATED RINGERS BOLUS IV
Freq: Once | INTRAVENOUS | Status: AC
Start: 2019-01-02 — End: 2019-01-02
  Administered 2019-01-02: 05:00:00 via INTRAVENOUS

## 2019-01-02 MED FILL — NIFEDIPINE 10 MG CAP: 10 mg | ORAL | Qty: 1

## 2019-01-02 NOTE — Progress Notes (Signed)
Patient discharged to home with preterm labor precautions and kick counts.  Patient verbalized understanding.

## 2019-01-02 NOTE — Progress Notes (Signed)
Patient discharged to home with preterm labor precautions and kick counts.  Patient verbalized understanding.

## 2019-01-09 NOTE — Telephone Encounter (Signed)
Patient called yesterday 01/08/19 stating that she had been having continuous contractions starting at her abdomen and radiating to her back. She said that she has tried putting her feet up, resting, and drinking glasses of water. She reported taking tylenol was not helping. She also said that she went to the ER the day be thanksgiving for the same problem. She reported baby moving 10 times per hour. She denied bleeding, leaking of fluid, stomach/bowel issues. We advised her to go to labor and delivery and on the phone she sounded unsure about going.

## 2019-01-10 ENCOUNTER — Ambulatory Visit: Attending: Women's Health

## 2019-01-10 ENCOUNTER — Ambulatory Visit: Admit: 2019-01-10 | Discharge: 2019-01-10 | Payer: BLUE CROSS/BLUE SHIELD | Attending: Women's Health

## 2019-01-10 DIAGNOSIS — O99519 Diseases of the respiratory system complicating pregnancy, unspecified trimester: Secondary | ICD-10-CM

## 2019-01-10 DIAGNOSIS — J45909 Unspecified asthma, uncomplicated: Secondary | ICD-10-CM

## 2019-01-10 NOTE — Assessment & Plan Note (Addendum)
Mostly lower extremity  Denies fever, chills, cough, loss of taste/smell    Recommend rest, elevation of LE, support belt, Tylenol, heat prn  If any covid symptoms develop, pt advised to seek care at [facility] for testing.

## 2019-01-10 NOTE — Assessment & Plan Note (Signed)
PTL/labor precautions, FMC, and pregnancy warning signs reviewed.  RTO 2 weeks

## 2019-01-10 NOTE — Assessment & Plan Note (Signed)
BP nl  Denies HA, vision changes, RUQ/epigastric pain, N/V or swelling.

## 2019-01-10 NOTE — Assessment & Plan Note (Signed)
noted 

## 2019-01-10 NOTE — Patient Instructions (Signed)
Thank you for coming.  Return to the office in 2 weeks.  Please call if you have any abdominal pain and 5 contractions in 1 hour, vaginal bleeding or spotting, or leaking of fluid.  You should feel the baby move 10 times in an hour. Monitor this once a day. If you do not feel the baby move this often please call.  Please call immediately if you have a headache that won't go away with tylenol, changes to your vision like funny lights or blurriness, nausea or vomiting, upper abdominal pain, or if the baby does not move at least 10 times in 2 hours.

## 2019-01-10 NOTE — Progress Notes (Signed)
This is a 28 y.o.  G6 P2123 at 711w5d for routine OB visit.    Her Estimated Due Date is 02/23/2019, by Last Menstrual Period    Denies leaking of fluid, vaginal bleeding, or regular contractions. Pt reports ctxs yesterday Q 5 minutes for 2 hours and then resolved. No ctxs today. But lower extremity achiness hips, legs, and feet. Reports fetal movement. Denies severe or persistent HA, vision changes, RUQ/epigastric pain, N/V or swelling.      Current Outpatient Medications on File Prior to Visit   Medication Sig Dispense Refill   ??? PNV no.153/FA/om3/dha/epa/fish (PRENATAL GUMMIES PO) Take  by mouth.     ??? aspirin delayed-release 81 mg tablet Take 2 Tabs by mouth daily. 60 Tab 11     No current facility-administered medications on file prior to visit.        No Known Allergies    OB History   Gravida Para Term Preterm AB Living   6 3 2 1 2 3    SAB TAB Ectopic Molar Multiple Live Births   0 0 0 0 0 3       # 1 - Date: None, Sex: None, Weight: None, GA: None, Delivery: None, Apgar1: None, Apgar5: None, Living: None, Birth Comments: None    # 2 - Date: None, Sex: None, Weight: None, GA: None, Delivery: None, Apgar1: None, Apgar5: None, Living: None, Birth Comments: None    # 3 - Date: 2012, Sex: Female, Weight: 5 lb 9 oz (2.523 kg), GA: 4893w0d, Delivery: Vaginal, Spontaneous, Apgar1: None, Apgar5: None, Living: Living, Birth Comments: None    # 4 - Date: 372015, Sex: None, Weight: 6 lb (2.722 kg), GA: 2565w0d, Delivery: Vaginal, Spontaneous, Apgar1: None, Apgar5: None, Living: Living, Birth Comments: None    # 5 - Date: 2017, Sex: None, Weight: 7 lb (3.175 kg), GA: 5736w0d, Delivery: Vaginal, Spontaneous, Apgar1: None, Apgar5: None, Living: Living, Birth Comments: None    # 6 - Date: None, Sex: None, Weight: None, GA: None, Delivery: None, Apgar1: None, Apgar5: None, Living: None, Birth Comments: None        Past Medical History:   Diagnosis Date   ??? Asthma    ??? History of placental abruption     ??? Pre-eclampsia in postpartum period    ??? Preeclampsia    ??? Umbilical hernia        Past Surgical History:   Procedure Laterality Date   ??? HX TONSILLECTOMY         Family History   Problem Relation Age of Onset   ??? Breast Cancer Paternal Aunt    ??? Ovarian Cancer Neg Hx    ??? Uterine Cancer Neg Hx    ??? Colon Cancer Neg Hx        Social History     Socioeconomic History   ??? Marital status: MARRIED     Spouse name: Not on file   ??? Number of children: Not on file   ??? Years of education: Not on file   ??? Highest education level: Not on file   Occupational History   ??? Not on file   Social Needs   ??? Financial resource strain: Not on file   ??? Food insecurity     Worry: Not on file     Inability: Not on file   ??? Transportation needs     Medical: Not on file     Non-medical: Not on file   Tobacco Use   ??? Smoking  status: Never Smoker   ??? Smokeless tobacco: Never Used   Substance and Sexual Activity   ??? Alcohol use: Not Currently   ??? Drug use: Never   ??? Sexual activity: Yes   Lifestyle   ??? Physical activity     Days per week: Not on file     Minutes per session: Not on file   ??? Stress: Not on file   Relationships   ??? Social Product manager on phone: Not on file     Gets together: Not on file     Attends religious service: Not on file     Active member of club or organization: Not on file     Attends meetings of clubs or organizations: Not on file     Relationship status: Not on file   ??? Intimate partner violence     Fear of current or ex partner: Not on file     Emotionally abused: Not on file     Physically abused: Not on file     Forced sexual activity: Not on file   Other Topics Concern   ??? Military Service Not Asked   ??? Blood Transfusions Not Asked   ??? Caffeine Concern No   ??? Occupational Exposure Not Asked   ??? Hobby Hazards Not Asked   ??? Sleep Concern Not Asked   ??? Stress Concern Not Asked   ??? Weight Concern Not Asked   ??? Special Diet Not Asked   ??? Back Care Not Asked   ??? Exercise No   ??? Bike Helmet Not Asked    ??? Seat Belt Yes   ??? Self-Exams Yes   Social History Narrative    Abuse: Feels safe at home, no history of physical abuse, no history of sexual abuse           Objective    Visit Vitals  BP 132/82 (BP 1 Location: Left arm, BP Patient Position: Sitting)   Temp 97.9 ??F (36.6 ??C) (Temporal)   Ht 5' (1.524 m)   Wt 192 lb (87.1 kg)   Breastfeeding No   BMI 37.50 kg/m??       General: well developed, well nourished, in no acute distress    Head: normocephalic and atraumatic    Resp: even and unlabored    Psych: Normal mood and affect        Assessment and Plan      Problem List  Never Reviewed          Codes Class    Body aches ICD-10-CM: R52  ICD-9-CM: 780.96         Preterm labor in third trimester ICD-10-CM: O60.03  ICD-9-CM: 644.20         Pruritus ICD-10-CM: L29.9  ICD-9-CM: 698.9         Abnormal glucose affecting pregnancy ICD-10-CM: O99.810  ICD-9-CM: 648.80     Overview Addendum 12/30/2018  8:05 AM by Marcelino Freestone, NP     Failed 1 hr, Passed 3hr GTT             Hypokalemia ICD-10-CM: E87.6  ICD-9-CM: 276.8     Overview Addendum 12/18/2018 11:03 AM by Marcelino Freestone, NP     10/28/2018: K+ 2.8 mEq in ER, received 40 mEQ IV    12/18/2018: K+ 3.6             Heart palpitations ICD-10-CM: R00.2  ICD-9-CM: 785.1         Multigravida  in third trimester ICD-10-CM: Z34.83  ICD-9-CM: V22.1     Overview Addendum 12/17/2018  9:04 AM by Vernell Barrier, NP     EDD 02/23/2019 by LMP c/w 16wk [place]    09/10/2018: AFP, CF, SMA negative  12/17/2018: Flu vaccine/TDAP recommended, Plans breastfeeding, desires BTL             Asthma during pregnancy ICD-10-CM: O99.519, J45.909  ICD-9-CM: 648.93, 493.90     Overview Signed 09/09/2018  3:22 PM by Vernell Barrier, NP     Rare inhaler use (1-2/year)             History of pre-eclampsia in prior pregnancy, currently pregnant in third trimester ICD-10-CM: O09.293  ICD-9-CM: V23.49     Overview Addendum 09/13/2018  7:32 AM by Vernell Barrier, NP      Hx of preeclampsia (requiring delivery at 36 weeks) and a Hx of postpartum preeclampsia  Baseline pre-e labs/urine, Start ASA 162 mg daily    09/11/2018: 24 hr urine protein 158             History of placenta abruption ICD-10-CM: Z87.59  ICD-9-CM: V13.29               Problem List Items Addressed This Visit        Respiratory    Asthma during pregnancy     noted            Other    Abnormal glucose affecting pregnancy     noted         Body aches     Mostly lower extremity  Denies fever, chills, cough, loss of taste/smell    Recommend rest, elevation of LE, support belt, Tylenol, heat prn  If any covid symptoms develop, pt advised to seek care at [facility] for testing.          History of placenta abruption     noted         History of pre-eclampsia in prior pregnancy, currently pregnant in third trimester     BP nl  Denies HA, vision changes, RUQ/epigastric pain, N/V or swelling.           Multigravida in third trimester     PTL/labor precautions, FMC, and pregnancy warning signs reviewed.  RTO 2 weeks               No orders of the defined types were placed in this encounter.      Outpatient Encounter Medications as of 01/10/2019   Medication Sig Dispense Refill   ??? PNV no.153/FA/om3/dha/epa/fish (PRENATAL GUMMIES PO) Take  by mouth.     ??? aspirin delayed-release 81 mg tablet Take 2 Tabs by mouth daily. 60 Tab 11     No facility-administered encounter medications on file as of 01/10/2019.                Labor signs, pregnancy warning signs, and fetal movement counting reviewed (if applicable)        Vernell Barrier, NP 01/10/19 9:20 AM

## 2019-01-10 NOTE — Progress Notes (Signed)
I have reviewed the patient's visit today including history, exam and assessment by Tyra Taylor, WHNP-BC.  I agree with treatment/plan as above.    Emilianna Barlowe Robert Kohen Reither, MD  9:26 AM  01/10/19

## 2019-01-10 NOTE — Progress Notes (Signed)
Patient comes in today for routine prenatal visit. Patient states that from her waist down she feels achy. She compared it to the feeling of like having the flu. She also c/o decreased energy.     Fetal Movements:  Yes  Contractions:  Yes  Vaginal Bleeding:  No  Leaking Fluid:  No  GI/GU issues:  No    Visit Vitals  BP 132/82 (BP 1 Location: Left arm, BP Patient Position: Sitting)   Temp 97.9 ??F (36.6 ??C) (Temporal)   Ht 5' (1.524 m)   Wt 192 lb (87.1 kg)   Breastfeeding No   BMI 37.50 kg/m??         Debra Warren E Skylor Hughson, CMA  01/10/19  9:05 AM

## 2019-01-10 NOTE — Assessment & Plan Note (Signed)
Mostly lower extremity  Denies fever, chills, cough, loss of taste/smell    Recommend rest, elevation of LE, support belt, Tylenol, heat prn  If any covid symptoms develop, pt advised to seek care at [facility] for testing.

## 2019-01-10 NOTE — Progress Notes (Signed)
I have reviewed the patient's visit today including history, exam and assessment by Tyra Taylor, WHNP-BC.  I agree with treatment/plan as above.    Caprisha Bridgett Robert Angelize Ryce, MD  9:26 AM  01/10/19

## 2019-01-10 NOTE — Progress Notes (Signed)
This is a 28 y.o.  G6 P2123 at [redacted]w[redacted]d for routine OB visit.    Her Estimated Due Date is 02/23/2019, by Last Menstrual Period    Denies leaking of fluid, vaginal bleeding, or regular contractions. Pt reports ctxs yesterday Q 5 minutes for 2 hours and then resolved. No ctxs today. But lower extremity achiness hips, legs, and feet. Reports fetal movement. Denies severe or persistent HA, vision changes, RUQ/epigastric pain, N/V or swelling.      Current Outpatient Medications on File Prior to Visit   Medication Sig Dispense Refill   ??? PNV no.153/FA/om3/dha/epa/fish (PRENATAL GUMMIES PO) Take  by mouth.     ??? aspirin delayed-release 81 mg tablet Take 2 Tabs by mouth daily. 60 Tab 11     No current facility-administered medications on file prior to visit.        No Known Allergies    OB History   Gravida Para Term Preterm AB Living   6 3 2 1 2 3    SAB TAB Ectopic Molar Multiple Live Births   0 0 0 0 0 3       # 1 - Date: None, Sex: None, Weight: None, GA: None, Delivery: None, Apgar1: None, Apgar5: None, Living: None, Birth Comments: None    # 2 - Date: None, Sex: None, Weight: None, GA: None, Delivery: None, Apgar1: None, Apgar5: None, Living: None, Birth Comments: None    # 3 - Date: 2013, Sex: Female, Weight: 5 lb 9 oz (2.523 kg), GA: [redacted]w[redacted]d, Delivery: Vaginal, Spontaneous, Apgar1: None, Apgar5: None, Living: Living, Birth Comments: None    # 4 - Date: [birth year], Sex: None, Weight: 6 lb (2.722 kg), GA: [redacted]w[redacted]d, Delivery: Vaginal, Spontaneous, Apgar1: None, Apgar5: None, Living: Living, Birth Comments: None    # 5 - Date: 2017, Sex: None, Weight: 7 lb (3.175 kg), GA: [redacted]w[redacted]d, Delivery: Vaginal, Spontaneous, Apgar1: None, Apgar5: None, Living: Living, Birth Comments: None    # 6 - Date: None, Sex: None, Weight: None, GA: None, Delivery: None, Apgar1: None, Apgar5: None, Living: None, Birth Comments: None        Past Medical History:   Diagnosis Date   ??? Asthma    ??? History of placental abruption    ??? Pre-eclampsia in postpartum  period    ??? Preeclampsia    ??? Umbilical hernia        Past Surgical History:   Procedure Laterality Date   ??? HX TONSILLECTOMY         Family History   Problem Relation Age of Onset   ??? Breast Cancer Paternal Aunt    ??? Ovarian Cancer Neg Hx    ??? Uterine Cancer Neg Hx    ??? Colon Cancer Neg Hx        Social History     Socioeconomic History   ??? Marital status: MARRIED     Spouse name: Not on file   ??? Number of children: Not on file   ??? Years of education: Not on file   ??? Highest education level: Not on file   Occupational History   ??? Not on file   Social Needs   ??? Financial resource strain: Not on file   ??? Food insecurity     Worry: Not on file     Inability: Not on file   ??? Transportation needs     Medical: Not on file     Non-medical: Not on file   Tobacco Use   ??? Smoking  status: Never Smoker   ??? Smokeless tobacco: Never Used   Substance and Sexual Activity   ??? Alcohol use: Not Currently   ??? Drug use: Never   ??? Sexual activity: Yes   Lifestyle   ??? Physical activity     Days per week: Not on file     Minutes per session: Not on file   ??? Stress: Not on file   Relationships   ??? Social Wellsite geologistconnections     Talks on phone: Not on file     Gets together: Not on file     Attends religious service: Not on file     Active member of club or organization: Not on file     Attends meetings of clubs or organizations: Not on file     Relationship status: Not on file   ??? Intimate partner violence     Fear of current or ex partner: Not on file     Emotionally abused: Not on file     Physically abused: Not on file     Forced sexual activity: Not on file   Other Topics Concern   ??? Military Service Not Asked   ??? Blood Transfusions Not Asked   ??? Caffeine Concern No   ??? Occupational Exposure Not Asked   ??? Hobby Hazards Not Asked   ??? Sleep Concern Not Asked   ??? Stress Concern Not Asked   ??? Weight Concern Not Asked   ??? Special Diet Not Asked   ??? Back Care Not Asked   ??? Exercise No   ??? Bike Helmet Not Asked   ??? Seat Belt Yes   ??? Self-Exams Yes    Social History Narrative    Abuse: Feels safe at home, no history of physical abuse, no history of sexual abuse           Objective    Visit Vitals  BP 132/82 (BP 1 Location: Left arm, BP Patient Position: Sitting)   Temp 97.9 ??F (36.6 ??C) (Temporal)   Ht 5' (1.524 m)   Wt 192 lb (87.1 kg)   Breastfeeding No   BMI 37.50 kg/m??       General: well developed, well nourished, in no acute distress    Head: normocephalic and atraumatic    Resp: even and unlabored    Psych: Normal mood and affect        Assessment and Plan      Problem List  Never Reviewed          Codes Class    Body aches ICD-10-CM: R52  ICD-9-CM: 780.96         Preterm labor in third trimester ICD-10-CM: O60.03  ICD-9-CM: 644.20         Pruritus ICD-10-CM: L29.9  ICD-9-CM: 698.9         Abnormal glucose affecting pregnancy ICD-10-CM: O99.810  ICD-9-CM: 648.80     Overview Addendum 12/30/2018  8:05 AM by Vernell Barrieraylor, Jaimi Belle L, NP     Failed 1 hr, Passed 3hr GTT             Hypokalemia ICD-10-CM: E87.6  ICD-9-CM: 276.8     Overview Addendum 12/18/2018 11:03 AM by Vernell Barrieraylor, Indira Sorenson L, NP     10/28/2018: K+ 2.8 mEq in ER, received 40 mEQ IV    12/18/2018: K+ 3.6             Heart palpitations ICD-10-CM: R00.2  ICD-9-CM: 785.1         Multigravida  in third trimester ICD-10-CM: Z34.83  ICD-9-CM: V22.1     Overview Addendum 12/17/2018  9:04 AM by Vernell Barrier, NP     EDD 02/23/2019 by LMP c/w 16wk [place]    09/10/2018: AFP, CF, SMA negative  12/17/2018: Flu vaccine/TDAP recommended, Plans breastfeeding, desires BTL             Asthma during pregnancy ICD-10-CM: O99.519, J45.909  ICD-9-CM: 648.93, 493.90     Overview Signed 09/09/2018  3:22 PM by Vernell Barrier, NP     Rare inhaler use (1-2/year)             History of pre-eclampsia in prior pregnancy, currently pregnant in third trimester ICD-10-CM: O09.293  ICD-9-CM: V23.49     Overview Addendum 09/13/2018  7:32 AM by Vernell Barrier, NP     Hx of preeclampsia (requiring delivery at 36 weeks) and a Hx of postpartum  preeclampsia  Baseline pre-e labs/urine, Start ASA 162 mg daily    09/11/2018: 24 hr urine protein 158             History of placenta abruption ICD-10-CM: Z87.59  ICD-9-CM: V13.29               Problem List Items Addressed This Visit        Respiratory    Asthma during pregnancy     noted            Other    Abnormal glucose affecting pregnancy     noted         Body aches     Mostly lower extremity  Denies fever, chills, cough, loss of taste/smell    Recommend rest, elevation of LE, support belt, Tylenol, heat prn  If any covid symptoms develop, pt advised to seek care at [facility] for testing.          History of placenta abruption     noted         History of pre-eclampsia in prior pregnancy, currently pregnant in third trimester     BP nl  Denies HA, vision changes, RUQ/epigastric pain, N/V or swelling.           Multigravida in third trimester     PTL/labor precautions, FMC, and pregnancy warning signs reviewed.  RTO 2 weeks               No orders of the defined types were placed in this encounter.      Outpatient Encounter Medications as of 01/10/2019   Medication Sig Dispense Refill   ??? PNV no.153/FA/om3/dha/epa/fish (PRENATAL GUMMIES PO) Take  by mouth.     ??? aspirin delayed-release 81 mg tablet Take 2 Tabs by mouth daily. 60 Tab 11     No facility-administered encounter medications on file as of 01/10/2019.                Labor signs, pregnancy warning signs, and fetal movement counting reviewed (if applicable)        Vernell Barrier, NP 01/10/19 9:20 AM

## 2019-01-10 NOTE — Progress Notes (Signed)
 Patient comes in today for routine prenatal visit. Patient states that from her waist down she feels achy. She compared it to the feeling of like having the flu. She also c/o decreased energy.     Fetal Movements:  Yes  Contractions:  Yes  Vaginal Bleeding:  No  Leaking Fluid:  No  GI/GU issues:  No    Visit Vitals  BP 132/82 (BP 1 Location: Left arm, BP Patient Position: Sitting)   Temp 97.9 F (36.6 C) (Temporal)   Ht 5' (1.524 m)   Wt 192 lb (87.1 kg)   Breastfeeding No   BMI 37.50 kg/m         Debra Warren Debra Warren, CMA  01/10/19  9:05 AM

## 2019-01-22 ENCOUNTER — Inpatient Hospital Stay
Admit: 2019-01-22 | Discharge: 2019-02-04 | Disposition: A | Discharge status: Skilled Nursing Facility | Payer: BLUE CROSS/BLUE SHIELD | Attending: Obstetrics & Gynecology | Admitting: Obstetrics & Gynecology

## 2019-01-22 DIAGNOSIS — O1404 Mild to moderate pre-eclampsia, complicating childbirth: Secondary | ICD-10-CM

## 2019-01-22 NOTE — Progress Notes (Signed)
SBAR report given to Angela Lawson RN. Care relinquished.

## 2019-01-22 NOTE — Progress Notes (Signed)
Patient presents to triage after a fall at the house. She states that she stood up and got dizzy and fell. Pt states that she has been taking her Bps at home due to "high risk for pre-eclampsia" and noticed that they have been "a little high." She states that she was going to follow up soon with OB, but then she fell so she came here. Patient reports left sided soreness at the site of impact, endorses good fetal movement and denies LOF or VB. Dr. Boyd notified of admission and initial BP and follow up BP.

## 2019-01-22 NOTE — Progress Notes (Signed)
FETAL SURVEILLANCE TESTING SUMMARY    INDICATIONS:  Maternal trauma    OBJECTIVE RESULTS:  Fetal heart variability: moderate  Fetal Heart Rate decelerations: none  Fetal Heart Rate accelerations: yes  Baseline FHR: 125 per minute  Uterine contractions: irregular, every 2-5 minutes    27 minutes of tracing reviewed    Fetal surveillance: reassuring    Nikky Duba D BoydMD

## 2019-01-22 NOTE — H&P (Addendum)
CC: fall at home    28 y.o. female [phone] at [redacted]w[redacted]d weeks gestation who requests evaluation for fall at home around 630 pm. She said she stood up and felt lightheaded. Did not lose consciousness but fell on side and hit abdomen. No vb or lof. Mild ha. No vision changes. Has been monitoring pressures at home and said they were a little high.   Her pregnancy issues include: hx of preeclampsia in last 2 pregnancies.    Fetal movement has beennormal .    HISTORY:  OB History   Gravida Para Term Preterm AB Living   '6 3 2 1 2 3   '$ SAB TAB Ectopic Molar Multiple Live Births             3      # Outcome Date GA Lbr Len/2nd Weight Sex Delivery Anes PTL Lv   6 Current            5 Term 2017 451w0d3.175 kg  Vag-Spont  N LIV      Complications: Preeclampsia in postpartum period   4 Term 2015 3722w0d.722 kg  Vag-Spont  N LIV      Complications: Abruptio Placenta   3 Preterm 2012 36w1w0d523 kg M Vag-Spont  N LIV      Complications: Preeclampsia   2 AB            1 AB                Past Surgical History:   Procedure Laterality Date   ??? HX TONSILLECTOMY         Past Medical History:   Diagnosis Date   ??? Asthma    ??? History of placental abruption    ??? Pre-eclampsia in postpartum period    ??? Preeclampsia    ??? Umbilical hernia        No Known Allergies    Family History   Problem Relation Age of Onset   ??? Breast Cancer Paternal Aunt    ??? Ovarian Cancer Neg Hx    ??? Uterine Cancer Neg Hx    ??? Colon Cancer Neg Hx        Social History     Socioeconomic History   ??? Marital status: MARRIED     Spouse name: Not on file   ??? Number of children: Not on file   ??? Years of education: Not on file   ??? Highest education level: Not on file   Occupational History   ??? Not on file   Social Needs   ??? Financial resource strain: Not on file   ??? Food insecurity     Worry: Not on file     Inability: Not on file   ??? Transportation needs     Medical: Not on file     Non-medical: Not on file   Tobacco Use   ??? Smoking status: Never Smoker    ??? Smokeless tobacco: Never Used   Substance and Sexual Activity   ??? Alcohol use: Not Currently   ??? Drug use: Never   ??? Sexual activity: Yes   Lifestyle   ??? Physical activity     Days per week: Not on file     Minutes per session: Not on file   ??? Stress: Not on file   Relationships   ??? Social connProduct managerphone: Not on file     Gets together: Not  on file     Attends religious service: Not on file     Active member of club or organization: Not on file     Attends meetings of clubs or organizations: Not on file     Relationship status: Not on file   ??? Intimate partner violence     Fear of current or ex partner: Not on file     Emotionally abused: Not on file     Physically abused: Not on file     Forced sexual activity: Not on file   Other Topics Concern   ??? Military Service Not Asked   ??? Blood Transfusions Not Asked   ??? Caffeine Concern No   ??? Occupational Exposure Not Asked   ??? Hobby Hazards Not Asked   ??? Sleep Concern Not Asked   ??? Stress Concern Not Asked   ??? Weight Concern Not Asked   ??? Special Diet Not Asked   ??? Back Care Not Asked   ??? Exercise No   ??? Bike Helmet Not Asked   ??? Seat Belt Yes   ??? Self-Exams Yes   Social History Narrative    Abuse: Feels safe at home, no history of physical abuse, no history of sexual abuse       ROS:  Negative:   negative 10 point ROS except as noted in HPI    Positive:   per hpi    PHYSICAL EXAM:  Blood pressure 136/82, pulse 87, last menstrual period 05/19/2018, not currently breastfeeding.  Initial bp elevated 164/95, then 148/84    The patient appears well, alert, oriented x 3. Appropriate affect.   Lungs are clear.   Heart RRR, no murmurs.   Abdomen soft, non-tender, no rebound/guarding, normoactive bs. No bruising  Fundus soft and non tender  Skin warm, dry, no rashes  Ext 1+edema, DTR's normal    Cervix: deferred  Fetal Heart Rate: see nst  I personally reviewed and interpreted the FHR tracing  Having some contractions.     I have personally reviewed the patient's history, prenatal record, and pertinent test results.   vital sign trends, laboratory results, previous provider notes support my clinical impression.      Assessment:  28 y.o. female at [redacted]w[redacted]d fall at home with reassuring tracing and some contractions, elevated bp and hx of preeclampsia    Plan:  Get labs. Monitor contractions. Observe at least 4 hours, and if contractions continue admit for overnight observation.    Signed By:  KCharm Rings MD     January 22, 2019        Addendum:  Lab Results   Component Value Date/Time    WBC 8.9 01/22/2019 07:50 PM    HGB 10.1 (L) 01/22/2019 07:50 PM    HCT 31.1 (L) 01/22/2019 07:50 PM    PLATELET 245 01/22/2019 07:50 PM    MCV 77.4 (L) 01/22/2019 07:50 PM     Lab Results   Component Value Date/Time    Sodium 139 01/22/2019 07:50 PM    Potassium 2.4 (L) 01/22/2019 07:50 PM    Chloride 105 01/22/2019 07:50 PM    CO2 25 01/22/2019 07:50 PM    Anion gap 9 01/22/2019 07:50 PM    Glucose 74 01/22/2019 07:50 PM    BUN 3 (L) 01/22/2019 07:50 PM    Creatinine 0.40 (L) 01/22/2019 07:50 PM    BUN/Creatinine ratio 13 12/27/2018 09:43 AM    GFR est AA >60 01/22/2019 07:50 PM  GFR est non-AA >60 01/22/2019 07:50 PM    Calcium 8.5 01/22/2019 07:50 PM    Bilirubin, total 0.9 01/22/2019 07:50 PM    Alk. phosphatase 241 (H) 01/22/2019 07:50 PM    Protein, total 6.8 01/22/2019 07:50 PM    Albumin 3.1 (L) 01/22/2019 07:50 PM    Globulin 3.7 (H) 01/22/2019 07:50 PM    A-G Ratio 0.8 (L) 01/22/2019 07:50 PM    ALT (SGPT) 47 01/22/2019 07:50 PM    AST (SGOT) 59 (H) 01/22/2019 07:50 PM     Uric acid 4.8     Still with occasional bp elevation.   fhr tracing reactive and now starting to feel contractions.  Discussed with dr. Bonney Leitz. Admit for 24 hour urine, repeat labs in am, continuous monitoring. Steroids as delivered early last pregnancy.    Debra Warren D. Luciana Axe MD

## 2019-01-22 NOTE — Progress Notes (Signed)
Patient moved to room 464. 2227 toco and cardio applied.

## 2019-01-22 NOTE — Progress Notes (Signed)
 Patient presents to triage after a fall at the house. She states that she stood up and got dizzy and fell. Pt states that she has been taking her Bps at home due to high risk for pre-eclampsia and noticed that they have been a little high. She states that she was going to follow up soon with OB, but then she fell so she came here. Patient reports left sided soreness at the site of impact, endorses good fetal movement and denies LOF or VB. Dr. Jolee notified of admission and initial BP and follow up BP.

## 2019-01-22 NOTE — H&P (Signed)
CC: fall at home    28 y.o. female [phone] at [redacted]w[redacted]d weeks gestation who requests evaluation for fall at home around 630 pm. She said she stood up and felt lightheaded. Did not lose consciousness but fell on side and hit abdomen. No vb or lof. Mild ha. No vision changes. Has been monitoring pressures at home and said they were a little high.   Her pregnancy issues include: hx of preeclampsia in last 2 pregnancies.    Fetal movement has beennormal .    HISTORY:  OB History   Gravida Para Term Preterm AB Living   _0 SAB TAB Ectopic Molar Multiple Live Births             3      # Outcome Date GA Lbr Len/2nd Weight Sex Delivery Anes PTL Lv   6 Current            5 Term 2017 458w0d3.175 kg  Vag-Spont  N LIV      Complications: Preeclampsia in postpartum period   4 Term 2015 3749w0d.722 kg  Vag-Spont  N LIV      Complications: Abruptio Placenta   3 Preterm 2012 36w68w0d523 kg M Vag-Spont  N LIV      Complications: Preeclampsia   2 AB            1 AB                Past Surgical History:   Procedure Laterality Date   ??? HX TONSILLECTOMY         Past Medical History:   Diagnosis Date   ??? Asthma    ??? History of placental abruption    ??? Pre-eclampsia in postpartum period    ??? Preeclampsia    ??? Umbilical hernia        No Known Allergies    Family History   Problem Relation Age of Onset   ??? Breast Cancer Paternal Aunt    ??? Ovarian Cancer Neg Hx    ??? Uterine Cancer Neg Hx    ??? Colon Cancer Neg Hx        Social History     Socioeconomic History   ??? Marital status: MARRIED     Spouse name: Not on file   ??? Number of children: Not on file   ??? Years of education: Not on file   ??? Highest education level: Not on file   Occupational History   ??? Not on file   Social Needs   ??? Financial resource strain: Not on file   ??? Food insecurity     Worry: Not on file     Inability: Not on file   ??? Transportation needs     Medical: Not on file     Non-medical: Not on file   Tobacco Use   ??? Smoking status: Never Smoker   ??? Smokeless  tobacco: Never Used   Substance and Sexual Activity   ??? Alcohol use: Not Currently   ??? Drug use: Never   ??? Sexual activity: Yes   Lifestyle   ??? Physical activity     Days per week: Not on file     Minutes per session: Not on file   ??? Stress: Not on file   Relationships   ??? Social connProduct managerphone: Not on file     Gets together: Not  on file     Attends religious service: Not on file     Active member of club or organization: Not on file     Attends meetings of clubs or organizations: Not on file     Relationship status: Not on file   ??? Intimate partner violence     Fear of current or ex partner: Not on file     Emotionally abused: Not on file     Physically abused: Not on file     Forced sexual activity: Not on file   Other Topics Concern   ??? Military Service Not Asked   ??? Blood Transfusions Not Asked   ??? Caffeine Concern No   ??? Occupational Exposure Not Asked   ??? Hobby Hazards Not Asked   ??? Sleep Concern Not Asked   ??? Stress Concern Not Asked   ??? Weight Concern Not Asked   ??? Special Diet Not Asked   ??? Back Care Not Asked   ??? Exercise No   ??? Bike Helmet Not Asked   ??? Seat Belt Yes   ??? Self-Exams Yes   Social History Narrative    Abuse: Feels safe at home, no history of physical abuse, no history of sexual abuse       ROS:  Negative:   negative 10 point ROS except as noted in HPI    Positive:   per hpi    PHYSICAL EXAM:  Blood pressure 136/82, pulse 87, last menstrual period 05/19/2018, not currently breastfeeding.  Initial bp elevated 164/95, then 148/84    The patient appears well, alert, oriented x 3. Appropriate affect.   Lungs are clear.   Heart RRR, no murmurs.   Abdomen soft, non-tender, no rebound/guarding, normoactive bs. No bruising  Fundus soft and non tender  Skin warm, dry, no rashes  Ext 1+edema, DTR's normal    Cervix: deferred  Fetal Heart Rate: see nst  I personally reviewed and interpreted the FHR tracing  Having some contractions.    I have personally reviewed the patient's history,  prenatal record, and pertinent test results.   vital sign trends, laboratory results, previous provider notes support my clinical impression.      Assessment:  28 y.o. female at [redacted]w[redacted]d fall at home with reassuring tracing and some contractions, elevated bp and hx of preeclampsia    Plan:  Get labs. Monitor contractions. Observe at least 4 hours, and if contractions continue admit for overnight observation.    Signed By:  KCharm Rings MD     January 22, 2019        Addendum:  Lab Results   Component Value Date/Time    WBC 8.9 01/22/2019 07:50 PM    HGB 10.1 (L) 01/22/2019 07:50 PM    HCT 31.1 (L) 01/22/2019 07:50 PM    PLATELET 245 01/22/2019 07:50 PM    MCV 77.4 (L) 01/22/2019 07:50 PM     Lab Results   Component Value Date/Time    Sodium 139 01/22/2019 07:50 PM    Potassium 2.4 (L) 01/22/2019 07:50 PM    Chloride 105 01/22/2019 07:50 PM    CO2 25 01/22/2019 07:50 PM    Anion gap 9 01/22/2019 07:50 PM    Glucose 74 01/22/2019 07:50 PM    BUN 3 (L) 01/22/2019 07:50 PM    Creatinine 0.40 (L) 01/22/2019 07:50 PM    BUN/Creatinine ratio 13 12/27/2018 09:43 AM    GFR est AA >60 01/22/2019 07:50 PM  GFR est non-AA >60 01/22/2019 07:50 PM    Calcium 8.5 01/22/2019 07:50 PM    Bilirubin, total 0.9 01/22/2019 07:50 PM    Alk. phosphatase 241 (H) 01/22/2019 07:50 PM    Protein, total 6.8 01/22/2019 07:50 PM    Albumin 3.1 (L) 01/22/2019 07:50 PM    Globulin 3.7 (H) 01/22/2019 07:50 PM    A-G Ratio 0.8 (L) 01/22/2019 07:50 PM    ALT (SGPT) 47 01/22/2019 07:50 PM    AST (SGOT) 59 (H) 01/22/2019 07:50 PM     Uric acid 4.8     Still with occasional bp elevation.   fhr tracing reactive and now starting to feel contractions.  Discussed with dr. Bonney Leitz. Admit for 24 hour urine, repeat labs in am, continuous monitoring. Steroids as delivered early last pregnancy.    Aaiden Depoy D. Luciana Axe MD

## 2019-01-22 NOTE — Progress Notes (Signed)
SBAR report given to Maryann Alar RN. Care relinquished.

## 2019-01-22 NOTE — Progress Notes (Signed)
FETAL SURVEILLANCE TESTING SUMMARY    INDICATIONS:  Maternal trauma    OBJECTIVE RESULTS:  Fetal heart variability: moderate  Fetal Heart Rate decelerations: none  Fetal Heart Rate accelerations: yes  Baseline FHR: 125 per minute  Uterine contractions: irregular, every 2-5 minutes    27 minutes of tracing reviewed    Fetal surveillance: reassuring    Lincoln Maxin BoydMD

## 2019-01-22 NOTE — Progress Notes (Signed)
Patient moved to room 464. 2227 toco and cardio applied.

## 2019-01-23 LAB — COMPREHENSIVE METABOLIC PANEL
ALT: 47 U/L (ref 12–65)
ALT: 43 U/L (ref 12–65)
AST: 59 U/L — ABNORMAL HIGH (ref 15–37)
AST: 55 U/L — ABNORMAL HIGH (ref 15–37)
Albumin/Globulin Ratio: 0.8 — ABNORMAL LOW (ref 1.2–3.5)
Albumin/Globulin Ratio: 0.8 — ABNORMAL LOW (ref 1.2–3.5)
Albumin: 3.1 g/dL — ABNORMAL LOW (ref 3.5–5.0)
Albumin: 2.9 g/dL — ABNORMAL LOW (ref 3.5–5.0)
Alkaline Phosphatase: 241 U/L — ABNORMAL HIGH (ref 50–136)
Alkaline Phosphatase: 228 U/L — ABNORMAL HIGH (ref 50–130)
Anion Gap: 9 mmol/L (ref 7–16)
Anion Gap: 9 mmol/L (ref 7–16)
BUN: 3 MG/DL — ABNORMAL LOW (ref 6–23)
BUN: 3 MG/DL — ABNORMAL LOW (ref 6–23)
CO2: 25 mmol/L (ref 21–32)
CO2: 24 mmol/L (ref 21–32)
Calcium: 8.5 MG/DL (ref 8.3–10.4)
Calcium: 8.8 MG/DL (ref 8.3–10.4)
Chloride: 105 mmol/L (ref 98–107)
Chloride: 109 mmol/L — ABNORMAL HIGH (ref 98–107)
Creatinine: 0.4 MG/DL — ABNORMAL LOW (ref 0.6–1.0)
Creatinine: 0.36 MG/DL — ABNORMAL LOW (ref 0.6–1.0)
EGFR IF NonAfrican American: >60 mL/min/{1.73_m2} (ref >60)
EGFR IF NonAfrican American: >60 mL/min/{1.73_m2} (ref >60)
GFR African American: >60 mL/min/{1.73_m2} (ref >60)
GFR African American: >60 mL/min/{1.73_m2} (ref >60)
Globulin: 3.7 g/dL — ABNORMAL HIGH (ref 2.3–3.5)
Globulin: 3.5 g/dL (ref 2.3–3.5)
Glucose: 74 mg/dL (ref 65–100)
Glucose: 104 mg/dL — ABNORMAL HIGH (ref 65–100)
Potassium: 2.4 mmol/L — ABNORMAL LOW (ref 3.5–5.1)
Potassium: 2.6 mmol/L — ABNORMAL LOW (ref 3.5–5.1)
Sodium: 139 mmol/L (ref 136–145)
Sodium: 142 mmol/L (ref 136–145)
Total Bilirubin: 0.9 MG/DL (ref 0.2–1.1)
Total Bilirubin: 1 MG/DL (ref 0.2–1.1)
Total Protein: 6.8 g/dL (ref 6.3–8.2)
Total Protein: 6.4 g/dL (ref 6.3–8.2)

## 2019-01-23 LAB — CBC
Hematocrit: 31.1 % — ABNORMAL LOW (ref 35.8–46.3)
Hematocrit: 29.6 % — ABNORMAL LOW (ref 35.8–46.3)
Hemoglobin: 10.1 g/dL — ABNORMAL LOW (ref 11.7–15.4)
Hemoglobin: 9.6 g/dL — ABNORMAL LOW (ref 11.7–15.4)
MCH: 25.1 PG — ABNORMAL LOW (ref 26.1–32.9)
MCH: 24.6 PG — ABNORMAL LOW (ref 26.1–32.9)
MCHC: 32.5 g/dL (ref 31.4–35.0)
MCHC: 32.4 g/dL (ref 31.4–35.0)
MCV: 77.4 FL — ABNORMAL LOW (ref 79.6–97.8)
MCV: 75.9 FL — ABNORMAL LOW (ref 79.6–97.8)
MPV: 9.9 FL (ref 9.4–12.3)
MPV: 10 FL (ref 9.4–12.3)
NRBC Absolute: 0.02 10*3/uL (ref 0.0–0.2)
NRBC Absolute: 0 10*3/uL (ref 0.0–0.2)
Platelets: 245 10*3/uL (ref 150–450)
Platelets: 229 10*3/uL (ref 150–450)
RBC: 4.02 M/uL — ABNORMAL LOW (ref 4.05–5.2)
RBC: 3.9 M/uL — ABNORMAL LOW (ref 4.05–5.2)
RDW: 13.8 % (ref 11.9–14.6)
RDW: 13.9 % (ref 11.9–14.6)
WBC: 8.9 10*3/uL (ref 4.3–11.1)
WBC: 8.4 10*3/uL (ref 4.3–11.1)

## 2019-01-23 LAB — PROTEIN / CREATININE RATIO, URINE
Creatinine, Ur: 97 mg/dL
Protein, Urine, Random: 30 mg/dL
Protein/Creat Ratio: 0.3

## 2019-01-23 LAB — URIC ACID
Uric Acid, Serum: 4.8 MG/DL (ref 2.6–6.0)
Uric acid: 4.8 mg/dL (ref 2.6–6.0)

## 2019-01-23 LAB — METABOLIC PANEL, COMPREHENSIVE
A-G Ratio: 0.8 — ABNORMAL LOW (ref 1.2–3.5)
A-G Ratio: 0.8 — ABNORMAL LOW (ref 1.2–3.5)
ALT (SGPT): 47 U/L (ref 12–65)
ALT (SGPT): 43 U/L (ref 12–65)
AST (SGOT): 59 U/L — ABNORMAL HIGH (ref 15–37)
AST (SGOT): 55 U/L — ABNORMAL HIGH (ref 15–37)
Albumin: 3.1 g/dL — ABNORMAL LOW (ref 3.5–5.0)
Albumin: 2.9 g/dL — ABNORMAL LOW (ref 3.5–5.0)
Alk. phosphatase: 241 U/L — ABNORMAL HIGH (ref 50–136)
Alk. phosphatase: 228 U/L — ABNORMAL HIGH (ref 50–130)
Anion gap: 9 mmol/L (ref 7–16)
Anion gap: 9 mmol/L (ref 7–16)
BUN: 3 MG/DL — ABNORMAL LOW (ref 6–23)
BUN: 3 MG/DL — ABNORMAL LOW (ref 6–23)
Bilirubin, total: 0.9 MG/DL (ref 0.2–1.1)
Bilirubin, total: 1 MG/DL (ref 0.2–1.1)
CO2: 25 mmol/L (ref 21–32)
CO2: 24 mmol/L (ref 21–32)
Calcium: 8.5 MG/DL (ref 8.3–10.4)
Calcium: 8.8 MG/DL (ref 8.3–10.4)
Chloride: 105 mmol/L (ref 98–107)
Chloride: 109 mmol/L — ABNORMAL HIGH (ref 98–107)
Creatinine: 0.4 MG/DL — ABNORMAL LOW (ref 0.6–1.0)
Creatinine: 0.36 MG/DL — ABNORMAL LOW (ref 0.6–1.0)
GFR est AA: >60 mL/min/{1.73_m2} (ref >60)
GFR est AA: >60 mL/min/{1.73_m2} (ref >60)
GFR est non-AA: >60 mL/min/{1.73_m2} (ref >60)
GFR est non-AA: >60 mL/min/{1.73_m2} (ref >60)
Globulin: 3.7 g/dL — ABNORMAL HIGH (ref 2.3–3.5)
Globulin: 3.5 g/dL (ref 2.3–3.5)
Glucose: 74 mg/dL (ref 65–100)
Glucose: 104 mg/dL — ABNORMAL HIGH (ref 65–100)
Potassium: 2.4 mmol/L — ABNORMAL LOW (ref 3.5–5.1)
Potassium: 2.6 mmol/L — ABNORMAL LOW (ref 3.5–5.1)
Protein, total: 6.8 g/dL (ref 6.3–8.2)
Protein, total: 6.4 g/dL (ref 6.3–8.2)
Sodium: 139 mmol/L (ref 136–145)
Sodium: 142 mmol/L (ref 136–145)

## 2019-01-23 LAB — CBC W/O DIFF
ABSOLUTE NRBC: 0.02 10*3/uL (ref 0.0–0.2)
ABSOLUTE NRBC: 0 10*3/uL (ref 0.0–0.2)
HCT: 31.1 % — ABNORMAL LOW (ref 35.8–46.3)
HCT: 29.6 % — ABNORMAL LOW (ref 35.8–46.3)
HGB: 10.1 g/dL — ABNORMAL LOW (ref 11.7–15.4)
HGB: 9.6 g/dL — ABNORMAL LOW (ref 11.7–15.4)
MCH: 25.1 PG — ABNORMAL LOW (ref 26.1–32.9)
MCH: 24.6 PG — ABNORMAL LOW (ref 26.1–32.9)
MCHC: 32.5 g/dL (ref 31.4–35.0)
MCHC: 32.4 g/dL (ref 31.4–35.0)
MCV: 77.4 FL — ABNORMAL LOW (ref 79.6–97.8)
MCV: 75.9 FL — ABNORMAL LOW (ref 79.6–97.8)
MPV: 9.9 FL (ref 9.4–12.3)
MPV: 10 FL (ref 9.4–12.3)
PLATELET: 245 10*3/uL (ref 150–450)
PLATELET: 229 10*3/uL (ref 150–450)
RBC: 4.02 M/uL — ABNORMAL LOW (ref 4.05–5.2)
RBC: 3.9 M/uL — ABNORMAL LOW (ref 4.05–5.2)
RDW: 13.8 % (ref 11.9–14.6)
RDW: 13.9 % (ref 11.9–14.6)
WBC: 8.9 10*3/uL (ref 4.3–11.1)
WBC: 8.4 10*3/uL (ref 4.3–11.1)

## 2019-01-23 LAB — PROTEIN/CREATININE RATIO, URINE
Creatinine, urine random: 97 mg/dL
Protein, urine random: 30 mg/dL
Protein/Creat. urine Ratio: 0.3

## 2019-01-23 MED ORDER — BETAMETHASONE ACET & SOD PHOS 6 MG/ML SUSP FOR INJECTION
6 mg/mL | INTRAMUSCULAR | Status: AC
Start: 2019-01-23 — End: 2019-01-23
  Administered 2019-01-23 – 2019-01-24 (×2): via INTRAMUSCULAR

## 2019-01-23 MED ORDER — SODIUM CHLORIDE 0.9 % IJ SYRG
Freq: Three times a day (TID) | INTRAMUSCULAR | Status: DC
Start: 2019-01-23 — End: 2019-02-03
  Administered 2019-01-26 – 2019-02-03 (×4): via INTRAVENOUS

## 2019-01-23 MED ORDER — POTASSIUM CHLORIDE SR 20 MEQ TAB, PARTICLES/CRYSTALS
20 mEq | Freq: Two times a day (BID) | ORAL | Status: DC
Start: 2019-01-23 — End: 2019-01-27
  Administered 2019-01-24 – 2019-01-27 (×7): via ORAL

## 2019-01-23 MED ORDER — SODIUM CHLORIDE 0.9 % IJ SYRG
INTRAMUSCULAR | Status: DC | PRN
Start: 2019-01-23 — End: 2019-02-03
  Administered 2019-01-27 – 2019-02-03 (×3): via INTRAVENOUS

## 2019-01-23 MED ORDER — ACETAMINOPHEN 325 MG TABLET
325 mg | ORAL | Status: DC | PRN
Start: 2019-01-23 — End: 2019-02-03
  Administered 2019-01-23 – 2019-01-30 (×5): via ORAL

## 2019-01-23 MED FILL — MAPAP (ACETAMINOPHEN) 325 MG TABLET: 325 mg | ORAL | Qty: 2

## 2019-01-23 MED FILL — CELESTONE SOLUSPAN 6 MG/ML SUSPENSION FOR INJECTION: 6 mg/mL | INTRAMUSCULAR | Qty: 5

## 2019-01-23 NOTE — Progress Notes (Deleted)
Patient complaints of back pain request tylenol.  0123 tylenol given po.

## 2019-01-23 NOTE — Progress Notes (Signed)
SBAR report received from A Lawson RN

## 2019-01-23 NOTE — Progress Notes (Signed)
SBAR report to A Lawson RN

## 2019-01-23 NOTE — Progress Notes (Signed)
Patient with eyes closed no complaints voiced at this time call light in reach.

## 2019-01-23 NOTE — Progress Notes (Signed)
Intake and Output started at 2300 -0630  Intake--290  Output--350

## 2019-01-23 NOTE — Progress Notes (Signed)
01/23/19 1708   Intake (mL)   P.O. 826 mL   Output (mL)   Urine Voided 800 ml

## 2019-01-23 NOTE — Progress Notes (Signed)
Patient complaints of back pain request tylenol.  0123 tylenol given po.

## 2019-01-23 NOTE — Progress Notes (Signed)
Shift assessment complete.  Patient denies any headache, blurred visionor epigastric pain.  Denies any LOF or bleeding reports good fetal movement.  Patient to call out for any complaints or concerns call light within reach.

## 2019-01-23 NOTE — Progress Notes (Signed)
Phone report to Dr Keith if strip is reactive may change monitoring to NST/BID.

## 2019-01-23 NOTE — Progress Notes (Signed)
High Risk Obstetrics Progress Note    Name: Debra Warren MRN: [id number]  SSN: [id number]    Date of Birth: [birth year]  Age: 28 y.o.  Sex: female      Subjective:      LOS: 0 days    Estimated Date of Delivery: 02/23/19   Gestational Age Today: [redacted]w[redacted]d     Patient admitted for a fall. Was noted to have contractions and elevated BPs. Her P/C ratio was slightly elevated. She is now completing a 24 hour urine. States she does not have headache/RUQ pain/blurred vision.  Is noticing some slight contractions but very rare. .    Objective:     Vitals:   Patient Vitals for the past 24 hrs:   Temp Pulse Resp BP SpO2   01/23/19 1530 98.7 ??F (37.1 ??C) 88 16 132/78 ???   01/23/19 0919 97.8 ??F (36.6 ??C) 96 18 137/68 98 %   01/23/19 0614 ??? 82 ??? 132/72 ???   01/23/19 0033 ??? 99 ??? 132/69 ???   01/23/19 0018 ??? 100 ??? 132/72 ???   01/23/19 0003 ??? (!) 101 ??? 139/74 ???   01/22/19 2349 ??? 98 ??? (!) 146/67 ???   01/22/19 2333 ??? 80 ??? (!) 146/78 ???   01/22/19 2318 ??? 93 ??? (!) 141/72 ???   01/22/19 2305 ??? 80 ??? (!) 143/75 ???   01/22/19 2248 ??? 86 ??? 135/83 ???   01/22/19 2140 ??? 83 ??? (!) 140/74 ???   01/22/19 2125 ??? 86 ??? 130/69 ???   01/22/19 2110 ??? 82 ??? (!) 153/91 ???   01/22/19 2055 ??? 83 ??? (!) 144/87 ???   01/22/19 2040 ??? 77 ??? 138/84 ???   01/22/19 2026 ??? 78 ??? 131/84 ???   01/22/19 2010 ??? 81 ??? 138/83 ???   01/22/19 1955 ??? 83 ??? (!) 140/88 ???   01/22/19 1949 ??? 82 ??? 138/86 ???   01/22/19 1926 ??? 78 ??? 134/82 ???   01/22/19 1911 ??? 87 ??? 136/82 ???   01/22/19 1855 ??? 86 ??? (!) 148/84 ???   01/22/19 1839 ??? (!) 104 ??? (!) 164/95 ???     Date 01/22/19 0700 - 01/23/19 0659 01/23/19 0700 - 01/24/19 0659   Shift 0700-1859 1900-0659 24 Hour Total 0700-1859 1900-0659 24 Hour Total   INTAKE   P.O.  290 290 826  826     P.O.  290 290 826  826   Shift Total  290 290 826  826   OUTPUT   Urine  350 350 800  800     Urine Voided  350 350 800  800   Shift Total  350 350 800  800   NET  -60 -60 26  26   Weight (kg)             Physical Exam:  Patient without distress.  Abdomen: soft, nontender   Fundus: soft and non tender  Lower Extremities:  - Edema No   - Patellar Reflexes: 2+ bilaterally       Uterine Activity:  <6/hr    Fetal Heart Rate:  Reassuring but not reactive yet        Labs:   CBC:    Recent Labs     01/23/19  0707 01/22/19  1950 12/17/18  1010 10/28/18  1748 09/10/18  1044   WBC 8.4 8.9 9.6 10.4 6.9   HGB 9.6* 10.1* 11.0* 12.0 12.0   HCT 29.6* 31.1* 33.2* 35.6* 34.5  PLT 229 245 218 241 216       CMP:   Recent Labs     01/23/19  0707 01/22/19  1950 12/27/18  1145 12/27/18  0943 12/17/18  1010 11/06/18  0837 10/28/18  1748 09/10/18  1044   NA 142 139  --  143 143 139 139 137   K 2.6* 2.4*  --  3.5 3.6 3.6 2.8* 3.7   CL 109* 105  --  107* 107* 105 106 104   CO2 24 25  --  21 21 18* 24 20   AGAP 9 9  --   --   --   --  9  --    GLU 104* 74 81 174* 161* 103* 95 86   BUN 3* 3*  --  5* 4* 6 4* 6   CREA 0.36* 0.40*  --  0.40* 0.37* 0.41* 0.44* 0.46*   GFRAA >60 >60  --  163 167 161 >60 157   GFRNA >60 >60  --  141 145 140 >60 136   CA 8.8 8.5  --  9.2 9.0 9.3 9.1 9.2   MG  --   --   --   --   --  1.7  --   --    ALB 2.9* 3.1*  --  3.7* 3.7* 3.7* 3.0* 4.0   TP 6.4 6.8  --  5.7* 5.9* 5.9* 6.8 6.1   GLOB 3.5 3.7*  --   --   --   --  3.8*  --    AGRAT 0.8* 0.8*  --  1.9 1.7 1.7 0.8* 1.9   ALT 43 47  --  13 14 26  36 9       Recent Labs     01/22/19  1950 09/10/18  1044   URICA 4.8 3.9   LDH  --  130       Recent Glucose Results: Recent Glucose Results:   Recent Labs     01/23/19  0707 01/22/19  1950 12/27/18  1145 12/27/18  0943 12/17/18  1010 11/06/18  0837 10/28/18  1748 09/10/18  1044   GLU 104* 74 81 174* 161* 103* 95 86       Assessment and Plan:      Active Problems:    Multigravida in third trimester (09/09/2018)      Overview: EDD 02/23/2019 by LMP c/w 16wk 02/25/2019            09/10/2018: AFP, CF, SMA negative      12/17/2018: Flu vaccine/TDAP recommended, Plans breastfeeding, desires BTL      History of pre-eclampsia in prior pregnancy, currently pregnant in third trimester (09/09/2018)       Overview: Hx of preeclampsia (requiring delivery at 36 weeks) and a Hx of postpartum       preeclampsia      Baseline pre-e labs/urine, Start ASA 162 mg daily            09/11/2018: 24 hr urine protein 158      Fall at home (01/22/2019)      Mild preeclampsia (01/23/2019)       24 hour urine in progress  2nd celestone later tonight  Labs stable. AST decreased from 59-55  BP improved with rest, as has swelling    01/25/2019, MD  6:39 PM

## 2019-01-23 NOTE — Progress Notes (Signed)
Pt states she is feeling well and less swollen.  Fresh ice to 24 hour urine.

## 2019-01-23 NOTE — Progress Notes (Signed)
24 hour urine complete, labeled and taken to lab by this nurse.

## 2019-01-23 NOTE — Progress Notes (Signed)
High Risk Obstetrics Progress Note    Name: Debra Warren MRN: [id number]  SSN: [id number]    Date of Birth: May 15, 1990  Age: 28 y.o.  Sex: female      Subjective:      LOS: 0 days    Estimated Date of Delivery: 02/23/19   Gestational Age Today: [redacted]w[redacted]d     Patient admitted for a fall. Was noted to have contractions and elevated BPs. Her P/C ratio was slightly elevated. She is now completing a 24 hour urine. States she does not have headache/RUQ pain/blurred vision.  Is noticing some slight contractions but very rare. .    Objective:     Vitals:   Patient Vitals for the past 24 hrs:   Temp Pulse Resp BP SpO2   01/23/19 1530 98.7 ??F (37.1 ??C) 88 16 132/78 ???   01/23/19 0919 97.8 ??F (36.6 ??C) 96 18 137/68 98 %   01/23/19 0614 ??? 82 ??? 132/72 ???   01/23/19 0033 ??? 99 ??? 132/69 ???   01/23/19 0018 ??? 100 ??? 132/72 ???   01/23/19 0003 ??? (!) 101 ??? 139/74 ???   01/22/19 2349 ??? 98 ??? (!) 146/67 ???   01/22/19 2333 ??? 80 ??? (!) 146/78 ???   01/22/19 2318 ??? 93 ??? (!) 141/72 ???   01/22/19 2305 ??? 80 ??? (!) 143/75 ???   01/22/19 2248 ??? 86 ??? 135/83 ???   01/22/19 2140 ??? 83 ??? (!) 140/74 ???   01/22/19 2125 ??? 86 ??? 130/69 ???   01/22/19 2110 ??? 82 ??? (!) 153/91 ???   01/22/19 2055 ??? 83 ??? (!) 144/87 ???   01/22/19 2040 ??? 77 ??? 138/84 ???   01/22/19 2026 ??? 78 ??? 131/84 ???   01/22/19 2010 ??? 81 ??? 138/83 ???   01/22/19 1955 ??? 83 ??? (!) 140/88 ???   01/22/19 1949 ??? 82 ??? 138/86 ???   01/22/19 1926 ??? 78 ??? 134/82 ???   01/22/19 1911 ??? 87 ??? 136/82 ???   01/22/19 1855 ??? 86 ??? (!) 148/84 ???   01/22/19 1839 ??? (!) 104 ??? (!) 164/95 ???     Date 01/22/19 0700 - 01/23/19 0659 01/23/19 0700 - 01/24/19 0659   Shift 0700-1859 1900-0659 24 Hour Total 0700-1859 1900-0659 24 Hour Total   INTAKE   P.O.  290 290 826  826     P.O.  290 290 826  826   Shift Total  290 290 826  826   OUTPUT   Urine  350 350 800  800     Urine Voided  350 350 800  800   Shift Total  350 350 800  800   NET  -60 -60 26  26   Weight (kg)             Physical Exam:  Patient without distress.  Abdomen: soft, nontender  Fundus: soft and non  tender  Lower Extremities:  - Edema No   - Patellar Reflexes: 2+ bilaterally       Uterine Activity:  <6/hr    Fetal Heart Rate:  Reassuring but not reactive yet        Labs:   CBC:    Recent Labs     01/23/19  0707 01/22/19  1950 12/17/18  1010 10/28/18  1748 09/10/18  1044   WBC 8.4 8.9 9.6 10.4 6.9   HGB 9.6* 10.1* 11.0* 12.0 12.0   HCT 29.6* 31.1* 33.2* 35.6* 34.5  PLT 229 245 218 241 216       CMP:   Recent Labs     01/23/19  0707 01/22/19  1950 12/27/18  1145 12/27/18  0943 12/17/18  1010 11/06/18  0837 10/28/18  1748 09/10/18  1044   NA 142 139  --  143 143 139 139 137   K 2.6* 2.4*  --  3.5 3.6 3.6 2.8* 3.7   CL 109* 105  --  107* 107* 105 106 104   CO2 24 25  --  21 21 18* 24 20   AGAP 9 9  --   --   --   --  9  --    GLU 104* 74 81 174* 161* 103* 95 86   BUN 3* 3*  --  5* 4* 6 4* 6   CREA 0.36* 0.40*  --  0.40* 0.37* 0.41* 0.44* 0.46*   GFRAA >60 >60  --  163 167 161 >60 157   GFRNA >60 >60  --  141 145 140 >60 136   CA 8.8 8.5  --  9.2 9.0 9.3 9.1 9.2   MG  --   --   --   --   --  1.7  --   --    ALB 2.9* 3.1*  --  3.7* 3.7* 3.7* 3.0* 4.0   TP 6.4 6.8  --  5.7* 5.9* 5.9* 6.8 6.1   GLOB 3.5 3.7*  --   --   --   --  3.8*  --    AGRAT 0.8* 0.8*  --  1.9 1.7 1.7 0.8* 1.9   ALT 43 47  --  13 14 26  36 9       Recent Labs     01/22/19  1950 09/10/18  1044   URICA 4.8 3.9   LDH  --  130       Recent Glucose Results: Recent Glucose Results:   Recent Labs     01/23/19  0707 01/22/19  1950 12/27/18  1145 12/27/18  0943 12/17/18  1010 11/06/18  0837 10/28/18  1748 09/10/18  1044   GLU 104* 74 81 174* 161* 103* 95 86       Assessment and Plan:      Active Problems:    Multigravida in third trimester (09/09/2018)      Overview: EDD 02/23/2019 by LMP c/w 16wk [place]            09/10/2018: AFP, CF, SMA negative      12/17/2018: Flu vaccine/TDAP recommended, Plans breastfeeding, desires BTL      History of pre-eclampsia in prior pregnancy, currently pregnant in third trimester (09/09/2018)      Overview: Hx of preeclampsia  (requiring delivery at 36 weeks) and a Hx of postpartum       preeclampsia      Baseline pre-e labs/urine, Start ASA 162 mg daily            09/11/2018: 24 hr urine protein 158      Fall at home (01/22/2019)      Mild preeclampsia (01/23/2019)       24 hour urine in progress  2nd celestone later tonight  Labs stable. AST decreased from 59-55  BP improved with rest, as has swelling    Army Melia, MD  6:39 PM

## 2019-01-23 NOTE — Progress Notes (Signed)
01/23/19 1708   Intake (mL)   P.O. 826 mL   Output (mL)   Urine Voided 800 ml

## 2019-01-23 NOTE — Progress Notes (Signed)
Phone report to Dr Mellody Dance if strip is reactive may change monitoring to NST/BID.

## 2019-01-23 NOTE — Progress Notes (Signed)
Shift assessment complete.  Patient denies any headache, blurred visionor epigastric pain.  Denies any LOF or bleeding reports good fetal movement.  Patient to call out for any complaints or concerns call light within reach.

## 2019-01-23 NOTE — Progress Notes (Signed)
SBAR report to A [employer]

## 2019-01-23 NOTE — Progress Notes (Signed)
Patient complaints of back pain request tylenol.  0123 tylenol given po.

## 2019-01-23 NOTE — Progress Notes (Signed)
24 hour urine complete, labeled and taken to lab by this nurse.

## 2019-01-23 NOTE — Progress Notes (Signed)
Pt states she is feeling well and less swollen.  Fresh ice to 24 hour urine.

## 2019-01-23 NOTE — Progress Notes (Signed)
Patient with eyes closed no complaints voiced at this time call light in reach.

## 2019-01-23 NOTE — Progress Notes (Signed)
Intake and Output started at 2300 -0630  Intake--290  Output--350

## 2019-01-24 ENCOUNTER — Encounter: Payer: BLUE CROSS/BLUE SHIELD | Attending: Obstetrics & Gynecology

## 2019-01-24 LAB — COMPREHENSIVE METABOLIC PANEL
ALT: 39 U/L (ref 12–65)
AST: 38 U/L — ABNORMAL HIGH (ref 15–37)
Albumin/Globulin Ratio: 0.9 — ABNORMAL LOW (ref 1.2–3.5)
Albumin: 2.9 g/dL — ABNORMAL LOW (ref 3.5–5.0)
Alkaline Phosphatase: 203 U/L — ABNORMAL HIGH (ref 50–136)
Anion Gap: 10 mmol/L (ref 7–16)
BUN: 3 MG/DL — ABNORMAL LOW (ref 6–23)
CO2: 23 mmol/L (ref 21–32)
Calcium: 8.2 MG/DL — ABNORMAL LOW (ref 8.3–10.4)
Chloride: 108 mmol/L — ABNORMAL HIGH (ref 98–107)
Creatinine: 0.39 MG/DL — ABNORMAL LOW (ref 0.6–1.0)
EGFR IF NonAfrican American: >60 mL/min/{1.73_m2} (ref >60)
GFR African American: >60 mL/min/{1.73_m2} (ref >60)
Globulin: 3.1 g/dL (ref 2.3–3.5)
Glucose: 170 mg/dL — ABNORMAL HIGH (ref 65–100)
Potassium: 2.5 mmol/L — ABNORMAL LOW (ref 3.5–5.1)
Sodium: 141 mmol/L (ref 136–145)
Total Bilirubin: 0.6 MG/DL (ref 0.2–1.1)
Total Protein: 6 g/dL — ABNORMAL LOW (ref 6.3–8.2)

## 2019-01-24 LAB — LACTATE DEHYDROGENASE: LD: 174 U/L (ref 100–190)

## 2019-01-24 LAB — CBC
Hematocrit: 28.7 % — ABNORMAL LOW (ref 35.8–46.3)
Hemoglobin: 9.2 g/dL — ABNORMAL LOW (ref 11.7–15.4)
MCH: 24.9 PG — ABNORMAL LOW (ref 26.1–32.9)
MCHC: 32.1 g/dL (ref 31.4–35.0)
MCV: 77.6 FL — ABNORMAL LOW (ref 79.6–97.8)
MPV: 10.3 FL (ref 9.4–12.3)
NRBC Absolute: 0.02 10*3/uL (ref 0.0–0.2)
Platelets: 224 10*3/uL (ref 150–450)
RBC: 3.7 M/uL — ABNORMAL LOW (ref 4.05–5.2)
RDW: 14.1 % (ref 11.9–14.6)
WBC: 10 10*3/uL (ref 4.3–11.1)

## 2019-01-24 LAB — GRP B STREP SCRN BY PCR

## 2019-01-24 LAB — PROTEIN, URINE, 24 HR
Period of Collection: 24 hr
Period of collection: 24 h
Protein, Urine, Random: 25 mg/dL
Protein, Urine: 363 mg/24hr
Protein, urine random: 25 mg/dL
Protein,urine 24 hr: 363 mg/(24.h)
Volume: 1450 mL
Volume: 1450 mL

## 2019-01-24 LAB — URIC ACID
Uric Acid, Serum: 3.9 MG/DL (ref 2.6–6.0)
Uric acid: 3.9 mg/dL (ref 2.6–6.0)

## 2019-01-24 LAB — METABOLIC PANEL, COMPREHENSIVE
A-G Ratio: 0.9 — ABNORMAL LOW (ref 1.2–3.5)
ALT (SGPT): 39 U/L (ref 12–65)
AST (SGOT): 38 U/L — ABNORMAL HIGH (ref 15–37)
Albumin: 2.9 g/dL — ABNORMAL LOW (ref 3.5–5.0)
Alk. phosphatase: 203 U/L — ABNORMAL HIGH (ref 50–136)
Anion gap: 10 mmol/L (ref 7–16)
BUN: 3 MG/DL — ABNORMAL LOW (ref 6–23)
Bilirubin, total: 0.6 MG/DL (ref 0.2–1.1)
CO2: 23 mmol/L (ref 21–32)
Calcium: 8.2 MG/DL — ABNORMAL LOW (ref 8.3–10.4)
Chloride: 108 mmol/L — ABNORMAL HIGH (ref 98–107)
Creatinine: 0.39 MG/DL — ABNORMAL LOW (ref 0.6–1.0)
GFR est AA: >60 mL/min/{1.73_m2} (ref >60)
GFR est non-AA: >60 mL/min/{1.73_m2} (ref >60)
Globulin: 3.1 g/dL (ref 2.3–3.5)
Glucose: 170 mg/dL — ABNORMAL HIGH (ref 65–100)
Potassium: 2.5 mmol/L — ABNORMAL LOW (ref 3.5–5.1)
Protein, total: 6 g/dL — ABNORMAL LOW (ref 6.3–8.2)
Sodium: 141 mmol/L (ref 136–145)

## 2019-01-24 LAB — CBC W/O DIFF
ABSOLUTE NRBC: 0.02 10*3/uL (ref 0.0–0.2)
HCT: 28.7 % — ABNORMAL LOW (ref 35.8–46.3)
HGB: 9.2 g/dL — ABNORMAL LOW (ref 11.7–15.4)
MCH: 24.9 PG — ABNORMAL LOW (ref 26.1–32.9)
MCHC: 32.1 g/dL (ref 31.4–35.0)
MCV: 77.6 FL — ABNORMAL LOW (ref 79.6–97.8)
MPV: 10.3 FL (ref 9.4–12.3)
PLATELET: 224 10*3/uL (ref 150–450)
RBC: 3.7 M/uL — ABNORMAL LOW (ref 4.05–5.2)
RDW: 14.1 % (ref 11.9–14.6)
WBC: 10 10*3/uL (ref 4.3–11.1)

## 2019-01-24 LAB — LD: LD: 174 U/L (ref 100–190)

## 2019-01-24 MED ORDER — DIPHENHYDRAMINE 25 MG CAP
25 mg | ORAL | Status: AC
Start: 2019-01-24 — End: 2019-01-24
  Administered 2019-01-24: 06:00:00

## 2019-01-24 MED ORDER — DIPHENHYDRAMINE 25 MG CAP
25 mg | ORAL | Status: AC
Start: 2019-01-24 — End: 2019-01-24
  Administered 2019-01-24: 06:00:00 via ORAL

## 2019-01-24 MED ORDER — FERROUS SULFATE 325 MG (65 MG ELEMENTAL IRON) TAB
325 mg (65 mg iron) | Freq: Every day | ORAL | Status: DC
Start: 2019-01-24 — End: 2019-02-03
  Administered 2019-01-25 – 2019-02-02 (×9): via ORAL

## 2019-01-24 MED ORDER — ASPIRIN 81 MG CHEWABLE TAB
81 mg | Freq: Every day | ORAL | Status: DC
Start: 2019-01-24 — End: 2019-01-27
  Administered 2019-01-24 – 2019-01-26 (×3): via ORAL

## 2019-01-24 MED ORDER — DIPHENHYDRAMINE 25 MG CAP
25 mg | Freq: Every evening | ORAL | Status: DC | PRN
Start: 2019-01-24 — End: 2019-01-24

## 2019-01-24 MED FILL — POTASSIUM CHLORIDE SR 20 MEQ TAB, PARTICLES/CRYSTALS: 20 mEq | ORAL | Qty: 2

## 2019-01-24 MED FILL — MAPAP (ACETAMINOPHEN) 325 MG TABLET: 325 mg | ORAL | Qty: 2

## 2019-01-24 MED FILL — DIPHENHYDRAMINE 25 MG CAP: 25 mg | ORAL | Qty: 1

## 2019-01-24 MED FILL — ASPIRIN 81 MG CHEWABLE TAB: 81 mg | ORAL | Qty: 2

## 2019-01-24 MED FILL — CELESTONE SOLUSPAN 6 MG/ML SUSPENSION FOR INJECTION: 6 mg/mL | INTRAMUSCULAR | Qty: 5

## 2019-01-24 NOTE — Progress Notes (Signed)
[facility] Pregnancy Note  Debra Warren  [id number]    Patient admitted for obs for a fall but has hx preE, abruption in previous preg, and is now dx with mild preE this pregnancy. GFM. Denies HA, visual changes, abd pain. Her only c/o is intermittent low pain last night that sounds like it may have been ctx's.       Vitals:    Patient Vitals for the past 24 hrs:   BP Temp Pulse Resp   01/24/19 0926 118/74 97.8 ??F (36.6 ??C) 86 18   01/23/19 1530 132/78 98.7 ??F (37.1 ??C) 88 16     Temp (24hrs), Avg:98.3 ??F (36.8 ??C), Min:97.8 ??F (36.6 ??C), Max:98.7 ??F (37.1 ??C)    I&O:  No intake/output data recorded.            12/16 1901 - 12/18 0700  In: 1116 [P.O.:1116]  Out: 1150 [Urine:1150]    Exam:  Patient without distress.               Abdomen soft, non-tender               Fundus soft and non tender                      Lower extremities edema 1+               Patellar Reflexes: 2+ bilaterally               Clonus: absent     R nst this am, rare ctx's    Labs:   Recent Results (from the past 24 hour(s))   CBC W/O DIFF    Collection Time: 01/24/19  9:42 AM   Result Value Ref Range    WBC 10.0 4.3 - 11.1 K/uL    RBC 3.70 (L) 4.05 - 5.2 M/uL    HGB 9.2 (L) 11.7 - 15.4 g/dL    HCT 28.7 (L) 35.8 - 46.3 %    MCV 77.6 (L) 79.6 - 97.8 FL    MCH 24.9 (L) 26.1 - 32.9 PG    MCHC 32.1 31.4 - 35.0 g/dL    RDW 14.1 11.9 - 14.6 %    PLATELET 224 150 - 450 K/uL    MPV 10.3 9.4 - 12.3 FL    ABSOLUTE NRBC 0.02 0.0 - 0.2 K/uL   METABOLIC PANEL, COMPREHENSIVE    Collection Time: 01/24/19  9:42 AM   Result Value Ref Range    Sodium 141 136 - 145 mmol/L    Potassium 2.5 (L) 3.5 - 5.1 mmol/L    Chloride 108 (H) 98 - 107 mmol/L    CO2 23 21 - 32 mmol/L    Anion gap 10 7 - 16 mmol/L    Glucose 170 (H) 65 - 100 mg/dL    BUN 3 (L) 6 - 23 MG/DL    Creatinine 0.39 (L) 0.6 - 1.0 MG/DL    GFR est AA >60 >60 ml/min/1.38m    GFR est non-AA >60 >60 ml/min/1.752m   Calcium 8.2 (L) 8.3 - 10.4 MG/DL     Bilirubin, total 0.6 0.2 - 1.1 MG/DL    ALT (SGPT) 39 12 - 65 U/L    AST (SGOT) 38 (H) 15 - 37 U/L    Alk. phosphatase 203 (H) 50 - 136 U/L    Protein, total 6.0 (L) 6.3 - 8.2 g/dL    Albumin 2.9 (L) 3.5 - 5.0 g/dL  Globulin 3.1 2.3 - 3.5 g/dL    A-G Ratio 0.9 (L) 1.2 - 3.5     URIC ACID    Collection Time: 01/24/19  9:42 AM   Result Value Ref Range    Uric acid 3.9 2.6 - 6.0 MG/DL   LD    Collection Time: 01/24/19  9:42 AM   Result Value Ref Range    LD 174 100 - 190 U/L       Assessment:    Patient Active Problem List    Diagnosis Date Noted   ??? Mild preeclampsia 01/23/2019   ??? Fall at home 01/22/2019   ??? Pruritus 12/27/2018   ??? Hypokalemia 11/06/2018   ??? Heart palpitations 11/06/2018   ??? Multigravida in third trimester 09/09/2018   ??? Asthma during pregnancy 09/09/2018   ??? History of pre-eclampsia in prior pregnancy, currently pregnant in third trimester 09/09/2018       Plan:  Pt's labs are normal or stable. The lft's continue to trend down toward normal. bp's wnl. Pt anemic. She says she's had fatigue for the past couple wks. Says her pnv have no Fe in them.   Pt will not be going home given her hx. Last growth scan was in Nov.   D/w Dr Altamese Cabal.  May be able to get growth scan next wk.   Get gbs screen.   Start Fe.   Steroids mature.   Getting nst's bid

## 2019-01-24 NOTE — Progress Notes (Signed)
Patient calls out request tylenol for back and head pain.  Tylenol 650mg po given

## 2019-01-24 NOTE — Progress Notes (Signed)
Patient with eyes closed at this time.

## 2019-01-24 NOTE — Progress Notes (Signed)
nst begins

## 2019-01-24 NOTE — Progress Notes (Signed)
Patient with eyes closed at this time.

## 2019-01-24 NOTE — Progress Notes (Signed)
Patient calls out request tylenol for back and head pain.  Tylenol 650mg  po given

## 2019-01-24 NOTE — Progress Notes (Signed)
[facility] Pregnancy Note  Debra Warren  [id number]    Patient admitted for obs for a fall but has hx preE, abruption in previous preg, and is now dx with mild preE this pregnancy. GFM. Denies HA, visual changes, abd pain. Her only c/o is intermittent low pain last night that sounds like it may have been ctx's.       Vitals:    Patient Vitals for the past 24 hrs:   BP Temp Pulse Resp   01/24/19 0926 118/74 97.8 ??F (36.6 ??C) 86 18   01/23/19 1530 132/78 98.7 ??F (37.1 ??C) 88 16     Temp (24hrs), Avg:98.3 ??F (36.8 ??C), Min:97.8 ??F (36.6 ??C), Max:98.7 ??F (37.1 ??C)    I&O:  No intake/output data recorded.            12/16 1901 - 12/18 0700  In: 1116 [P.O.:1116]  Out: 1150 [Urine:1150]    Exam:  Patient without distress.               Abdomen soft, non-tender               Fundus soft and non tender                      Lower extremities edema 1+               Patellar Reflexes: 2+ bilaterally               Clonus: absent     R nst this am, rare ctx's    Labs:   Recent Results (from the past 24 hour(s))   CBC W/O DIFF    Collection Time: 01/24/19  9:42 AM   Result Value Ref Range    WBC 10.0 4.3 - 11.1 K/uL    RBC 3.70 (L) 4.05 - 5.2 M/uL    HGB 9.2 (L) 11.7 - 15.4 g/dL    HCT 28.7 (L) 35.8 - 46.3 %    MCV 77.6 (L) 79.6 - 97.8 FL    MCH 24.9 (L) 26.1 - 32.9 PG    MCHC 32.1 31.4 - 35.0 g/dL    RDW 14.1 11.9 - 14.6 %    PLATELET 224 150 - 450 K/uL    MPV 10.3 9.4 - 12.3 FL    ABSOLUTE NRBC 0.02 0.0 - 0.2 K/uL   METABOLIC PANEL, COMPREHENSIVE    Collection Time: 01/24/19  9:42 AM   Result Value Ref Range    Sodium 141 136 - 145 mmol/L    Potassium 2.5 (L) 3.5 - 5.1 mmol/L    Chloride 108 (H) 98 - 107 mmol/L    CO2 23 21 - 32 mmol/L    Anion gap 10 7 - 16 mmol/L    Glucose 170 (H) 65 - 100 mg/dL    BUN 3 (L) 6 - 23 MG/DL    Creatinine 0.39 (L) 0.6 - 1.0 MG/DL    GFR est AA >60 >60 ml/min/1.58m    GFR est non-AA >60 >60 ml/min/1.798m   Calcium 8.2 (L) 8.3 - 10.4 MG/DL    Bilirubin, total  0.6 0.2 - 1.1 MG/DL    ALT (SGPT) 39 12 - 65 U/L    AST (SGOT) 38 (H) 15 - 37 U/L    Alk. phosphatase 203 (H) 50 - 136 U/L    Protein, total 6.0 (L) 6.3 - 8.2 g/dL    Albumin 2.9 (L) 3.5 - 5.0 g/dL  Globulin 3.1 2.3 - 3.5 g/dL    A-G Ratio 0.9 (L) 1.2 - 3.5     URIC ACID    Collection Time: 01/24/19  9:42 AM   Result Value Ref Range    Uric acid 3.9 2.6 - 6.0 MG/DL   LD    Collection Time: 01/24/19  9:42 AM   Result Value Ref Range    LD 174 100 - 190 U/L       Assessment:    Patient Active Problem List    Diagnosis Date Noted   ??? Mild preeclampsia 01/23/2019   ??? Fall at home 01/22/2019   ??? Pruritus 12/27/2018   ??? Hypokalemia 11/06/2018   ??? Heart palpitations 11/06/2018   ??? Multigravida in third trimester 09/09/2018   ??? Asthma during pregnancy 09/09/2018   ??? History of pre-eclampsia in prior pregnancy, currently pregnant in third trimester 09/09/2018       Plan:  Pt's labs are normal or stable. The lft's continue to trend down toward normal. bp's wnl. Pt anemic. She says she's had fatigue for the past couple wks. Says her pnv have no Fe in them.   Pt will not be going home given her hx. Last growth scan was in Nov.   D/w Dr Altamese Cabal.  May be able to get growth scan next wk.   Get gbs screen.   Start Fe.   Steroids mature.   Getting nst's bid

## 2019-01-25 MED ORDER — IRON SUCROSE 100 MG/5 ML IV SOLN
100 mg iron/5 mL | Freq: Every day | INTRAVENOUS | Status: DC
Start: 2019-01-25 — End: 2019-01-25

## 2019-01-25 MED ORDER — SODIUM CHLORIDE 0.9 % IV
100 mg iron/5 mL | Freq: Every day | INTRAVENOUS | Status: AC
Start: 2019-01-25 — End: 2019-01-26
  Administered 2019-01-25 – 2019-01-26 (×2): via INTRAVENOUS

## 2019-01-25 MED ORDER — DIPHTH,PERTUS(AC)TETANUS VAC(PF) 2.5 LF UNIT-8 MCG-5 LF/0.5 ML INJ
INTRAMUSCULAR | Status: DC
Start: 2019-01-25 — End: 2019-02-03

## 2019-01-25 MED FILL — FERROUS SULFATE 325 MG (65 MG ELEMENTAL IRON) TAB: 325 mg (65 mg iron) | ORAL | Qty: 1

## 2019-01-25 MED FILL — MAPAP (ACETAMINOPHEN) 325 MG TABLET: 325 mg | ORAL | Qty: 2

## 2019-01-25 MED FILL — VENOFER 100 MG IRON/5 ML INTRAVENOUS SOLUTION: 100 mg iron/5 mL | INTRAVENOUS | Qty: 5

## 2019-01-25 MED FILL — ASPIRIN 81 MG CHEWABLE TAB: 81 mg | ORAL | Qty: 2

## 2019-01-25 MED FILL — POTASSIUM CHLORIDE SR 20 MEQ TAB, PARTICLES/CRYSTALS: 20 mEq | ORAL | Qty: 2

## 2019-01-25 NOTE — Progress Notes (Addendum)
0700 Bedside and Verbal shift change report given to K. Casteel RN (oncoming nurse) by Cassidy RN (offgoing nurse). Report included the following information SBAR.   0846 Pt. Up to chair. Morning meds given. See MAR. Bed linens changed.  0940 20 G PIV started in left AC for iron infusion.  0945-1005 NST reactive.  1005 Iron infusion complete.

## 2019-01-25 NOTE — Progress Notes (Signed)
[facility] High [facility] Pregnancy Note  Debra Warren  [id number]    Patient admitted for elevated bp's, hx preE and abruption with previous pregnancy. Dx with mild preE- also initially had slightly elevated lft's which have trended down. GFM. No complaints.       Vitals:    Patient Vitals for the past 24 hrs:   BP Temp Pulse Resp   01/25/19 0844 116/60 98.5 ??F (36.9 ??C) 81 17   01/25/19 0102 132/69 ??? 85 14   01/24/19 2118 118/69 ??? 83 ???   01/24/19 2117 118/69 98.3 ??F (36.8 ??C) 83 14   01/24/19 1444 125/63 ??? 88 ???     Temp (24hrs), Avg:98.4 ??F (36.9 ??C), Min:98.3 ??F (36.8 ??C), Max:98.5 ??F (36.9 ??C)    I&O:  No intake/output data recorded.            No intake/output data recorded.    Exam:  Patient without distress.               Abdomen soft, non-tender               Fundus soft and non tender                      R nst this am with baseline 130, no decels    Labs:   Recent Results (from the past 24 hour(s))   CULTURE, GENITAL GROUP B STREP    Collection Time: 01/24/19  2:43 PM    Specimen: VAGINAL/RECTAL   Result Value Ref Range    Special Requests: NO SPECIAL REQUESTS      Culture result: CULTURE IN PROGRESS,FURTHER UPDATES TO FOLLOW     GRP B STREP SCRN BY PCR    Collection Time: 01/24/19  2:43 PM    Specimen: VAGINAL/RECTAL   Result Value Ref Range    Special Requests: NO SPECIAL REQUESTS      Culture result:        NEGATIVE: GBS target nucleic acid is not detected. Presumed not colonized for GBS.       Assessment:    Patient Active Problem List    Diagnosis Date Noted   ??? Pregnancy 01/25/2019   ??? Mild preeclampsia 01/23/2019   ??? Fall at home 01/22/2019   ??? Pruritus 12/27/2018   ??? Hypokalemia 11/06/2018   ??? Heart palpitations 11/06/2018   ??? Multigravida in third trimester 09/09/2018   ??? Asthma during pregnancy 09/09/2018   ??? History of pre-eclampsia in prior pregnancy, currently pregnant in third trimester 09/09/2018       Plan:  Reck labs tom. Getting iv Fe now.

## 2019-01-25 NOTE — Progress Notes (Signed)
0700 Bedside and Verbal shift change report given to K. Print production planner (Cabin crew) by Rodman Pickle RN (offgoing nurse). Report included the following information SBAR.   0846 Pt. Up to chair. Morning meds given. See MAR. Bed linens changed.  0940 20 G PIV started in left [facility] for iron infusion.  6270-3500 NST reactive.  1005 Iron infusion complete.

## 2019-01-25 NOTE — Progress Notes (Signed)
[facility] High Risk Pregnancy Note  Debra Warren  [id number]    Patient admitted for elevated bp's, hx preE and abruption with previous pregnancy. Dx with mild preE- also initially had slightly elevated lft's which have trended down. GFM. No complaints.       Vitals:    Patient Vitals for the past 24 hrs:   BP Temp Pulse Resp   01/25/19 0844 116/60 98.5 ??F (36.9 ??C) 81 17   01/25/19 0102 132/69 ??? 85 14   01/24/19 2118 118/69 ??? 83 ???   01/24/19 2117 118/69 98.3 ??F (36.8 ??C) 83 14   01/24/19 1444 125/63 ??? 88 ???     Temp (24hrs), Avg:98.4 ??F (36.9 ??C), Min:98.3 ??F (36.8 ??C), Max:98.5 ??F (36.9 ??C)    I&O:  No intake/output data recorded.            No intake/output data recorded.    Exam:  Patient without distress.               Abdomen soft, non-tender               Fundus soft and non tender                      R nst this am with baseline 130, no decels    Labs:   Recent Results (from the past 24 hour(s))   CULTURE, GENITAL GROUP B STREP    Collection Time: 01/24/19  2:43 PM    Specimen: VAGINAL/RECTAL   Result Value Ref Range    Special Requests: NO SPECIAL REQUESTS      Culture result: CULTURE IN PROGRESS,FURTHER UPDATES TO FOLLOW     GRP B STREP SCRN BY PCR    Collection Time: 01/24/19  2:43 PM    Specimen: VAGINAL/RECTAL   Result Value Ref Range    Special Requests: NO SPECIAL REQUESTS      Culture result:        NEGATIVE: GBS target nucleic acid is not detected. Presumed not colonized for GBS.       Assessment:    Patient Active Problem List    Diagnosis Date Noted   ??? Pregnancy 01/25/2019   ??? Mild preeclampsia 01/23/2019   ??? Fall at home 01/22/2019   ??? Pruritus 12/27/2018   ??? Hypokalemia 11/06/2018   ??? Heart palpitations 11/06/2018   ??? Multigravida in third trimester 09/09/2018   ??? Asthma during pregnancy 09/09/2018   ??? History of pre-eclampsia in prior pregnancy, currently pregnant in third trimester 09/09/2018       Plan:  Reck labs tom. Getting iv Fe now.

## 2019-01-26 LAB — CBC
Hematocrit: 27.3 % — ABNORMAL LOW (ref 35.8–46.3)
Hemoglobin: 8.7 g/dL — ABNORMAL LOW (ref 11.7–15.4)
MCH: 24.6 PG — ABNORMAL LOW (ref 26.1–32.9)
MCHC: 31.9 g/dL (ref 31.4–35.0)
MCV: 77.3 FL — ABNORMAL LOW (ref 79.6–97.8)
MPV: 9.8 FL (ref 9.4–12.3)
NRBC Absolute: 0.05 10*3/uL (ref 0.0–0.2)
Platelets: 216 10*3/uL (ref 150–450)
RBC: 3.53 M/uL — ABNORMAL LOW (ref 4.05–5.2)
RDW: 14.3 % (ref 11.9–14.6)
WBC: 9.2 10*3/uL (ref 4.3–11.1)

## 2019-01-26 LAB — COMPREHENSIVE METABOLIC PANEL
ALT: 36 U/L (ref 12–65)
AST: 37 U/L (ref 15–37)
Albumin/Globulin Ratio: 0.8 — ABNORMAL LOW (ref 1.2–3.5)
Albumin: 2.5 g/dL — ABNORMAL LOW (ref 3.5–5.0)
Alkaline Phosphatase: 175 U/L — ABNORMAL HIGH (ref 50–136)
Anion Gap: 9 mmol/L (ref 7–16)
BUN: 4 MG/DL — ABNORMAL LOW (ref 6–23)
CO2: 24 mmol/L (ref 21–32)
Calcium: 8.1 MG/DL — ABNORMAL LOW (ref 8.3–10.4)
Chloride: 109 mmol/L — ABNORMAL HIGH (ref 98–107)
Creatinine: 0.26 MG/DL — ABNORMAL LOW (ref 0.6–1.0)
EGFR IF NonAfrican American: >60 mL/min/{1.73_m2} (ref >60)
GFR African American: >60 mL/min/{1.73_m2} (ref >60)
Globulin: 3 g/dL (ref 2.3–3.5)
Glucose: 91 mg/dL (ref 65–100)
Potassium: 2.7 mmol/L — ABNORMAL LOW (ref 3.5–5.1)
Sodium: 142 mmol/L (ref 136–145)
Total Bilirubin: 0.5 MG/DL (ref 0.2–1.1)
Total Protein: 5.5 g/dL — ABNORMAL LOW (ref 6.3–8.2)

## 2019-01-26 LAB — LACTATE DEHYDROGENASE: LD: 152 U/L (ref 100–190)

## 2019-01-26 LAB — URIC ACID
Uric Acid, Serum: 3.6 MG/DL (ref 2.6–6.0)
Uric acid: 3.6 mg/dL (ref 2.6–6.0)

## 2019-01-26 LAB — CBC W/O DIFF
ABSOLUTE NRBC: 0.05 10*3/uL (ref 0.0–0.2)
HCT: 27.3 % — ABNORMAL LOW (ref 35.8–46.3)
HGB: 8.7 g/dL — ABNORMAL LOW (ref 11.7–15.4)
MCH: 24.6 PG — ABNORMAL LOW (ref 26.1–32.9)
MCHC: 31.9 g/dL (ref 31.4–35.0)
MCV: 77.3 FL — ABNORMAL LOW (ref 79.6–97.8)
MPV: 9.8 FL (ref 9.4–12.3)
PLATELET: 216 10*3/uL (ref 150–450)
RBC: 3.53 M/uL — ABNORMAL LOW (ref 4.05–5.2)
RDW: 14.3 % (ref 11.9–14.6)
WBC: 9.2 10*3/uL (ref 4.3–11.1)

## 2019-01-26 LAB — METABOLIC PANEL, COMPREHENSIVE
A-G Ratio: 0.8 — ABNORMAL LOW (ref 1.2–3.5)
ALT (SGPT): 36 U/L (ref 12–65)
AST (SGOT): 37 U/L (ref 15–37)
Albumin: 2.5 g/dL — ABNORMAL LOW (ref 3.5–5.0)
Alk. phosphatase: 175 U/L — ABNORMAL HIGH (ref 50–136)
Anion gap: 9 mmol/L (ref 7–16)
BUN: 4 MG/DL — ABNORMAL LOW (ref 6–23)
Bilirubin, total: 0.5 MG/DL (ref 0.2–1.1)
CO2: 24 mmol/L (ref 21–32)
Calcium: 8.1 MG/DL — ABNORMAL LOW (ref 8.3–10.4)
Chloride: 109 mmol/L — ABNORMAL HIGH (ref 98–107)
Creatinine: 0.26 MG/DL — ABNORMAL LOW (ref 0.6–1.0)
GFR est AA: >60 mL/min/{1.73_m2} (ref >60)
GFR est non-AA: >60 mL/min/{1.73_m2} (ref >60)
Globulin: 3 g/dL (ref 2.3–3.5)
Glucose: 91 mg/dL (ref 65–100)
Potassium: 2.7 mmol/L — ABNORMAL LOW (ref 3.5–5.1)
Protein, total: 5.5 g/dL — ABNORMAL LOW (ref 6.3–8.2)
Sodium: 142 mmol/L (ref 136–145)

## 2019-01-26 LAB — LD: LD: 152 U/L (ref 100–190)

## 2019-01-26 MED ORDER — POLYETHYLENE GLYCOL 3350 17 GRAM (100 %) ORAL POWDER PACKET
17 gram | Freq: Two times a day (BID) | ORAL | Status: DC | PRN
Start: 2019-01-26 — End: 2019-02-03
  Administered 2019-01-27: 14:00:00 via ORAL

## 2019-01-26 MED ORDER — POTASSIUM CHLORIDE 20 MEQ/100 ML IV PIGGY BACK
20 mEq/100 mL | INTRAVENOUS | Status: AC
Start: 2019-01-26 — End: 2019-01-26
  Administered 2019-01-26 (×2): via INTRAVENOUS

## 2019-01-26 MED ORDER — ASCORBIC ACID 500 MG TAB
500 mg | Freq: Every day | ORAL | Status: DC
Start: 2019-01-26 — End: 2019-02-03
  Administered 2019-01-27 – 2019-02-02 (×7): via ORAL

## 2019-01-26 MED FILL — POTASSIUM CHLORIDE 20 MEQ/100 ML IV PIGGY BACK: 20 mEq/100 mL | INTRAVENOUS | Qty: 100

## 2019-01-26 MED FILL — ASPIRIN 81 MG CHEWABLE TAB: 81 mg | ORAL | Qty: 2

## 2019-01-26 MED FILL — POTASSIUM CHLORIDE SR 20 MEQ TAB, PARTICLES/CRYSTALS: 20 mEq | ORAL | Qty: 2

## 2019-01-26 MED FILL — FERROUS SULFATE 325 MG (65 MG ELEMENTAL IRON) TAB: 325 mg (65 mg iron) | ORAL | Qty: 1

## 2019-01-26 MED FILL — MAPAP (ACETAMINOPHEN) 325 MG TABLET: 325 mg | ORAL | Qty: 2

## 2019-01-26 NOTE — Progress Notes (Signed)
01/26/19 1045   Fetal Vital Signs   Mode External   Fetal Heart Rate 140   Fetal Activity Present   Variability 6-25 BPM   Decelerations None   Accelerations Yes   RN Reviewed Strip? Yes   Non Stress Test Reactive   Uterine Activity   Mode External   Frequency (min) 0

## 2019-01-26 NOTE — Progress Notes (Signed)
Patient care assumed from [name], RN. Report received.

## 2019-01-26 NOTE — Progress Notes (Signed)
[facility] High Risk Pregnancy Note  Debra Warren  [id number]    Patient admitted for elevated bp. Hx abruption and preE with prior preg. Has proteinuria so now with dx preE. GFM. No complaints at all.       Vitals:    Patient Vitals for the past 24 hrs:   BP Temp Pulse Resp   01/26/19 0830 137/83 99 ??F (37.2 ??C) 85 18   01/26/19 0454 (!) 122/57 98.1 ??F (36.7 ??C) 76 13   01/25/19 1958 118/75 98.3 ??F (36.8 ??C) 85 14   01/25/19 1707 129/84 98.5 ??F (36.9 ??C) 74 16     Temp (24hrs), Avg:98.5 ??F (36.9 ??C), Min:98.1 ??F (36.7 ??C), Max:99 ??F (37.2 ??C)    I&O:  No intake/output data recorded.            No intake/output data recorded.    Exam:  Patient without distress.               Abdomen soft, non-tender               Fundus soft and non tender                      Lower extremities edema 1+, pitting, shins               Patellar Reflexes normal per nurse                  Rnst this am with no ctx's and no decels    Labs:   Recent Results (from the past 24 hour(s))   CBC W/O DIFF    Collection Time: 01/26/19  4:51 AM   Result Value Ref Range    WBC 9.2 4.3 - 11.1 K/uL    RBC 3.53 (L) 4.05 - 5.2 M/uL    HGB 8.7 (L) 11.7 - 15.4 g/dL    HCT 27.3 (L) 35.8 - 46.3 %    MCV 77.3 (L) 79.6 - 97.8 FL    MCH 24.6 (L) 26.1 - 32.9 PG    MCHC 31.9 31.4 - 35.0 g/dL    RDW 14.3 11.9 - 14.6 %    PLATELET 216 150 - 450 K/uL    MPV 9.8 9.4 - 12.3 FL    ABSOLUTE NRBC 0.05 0.0 - 0.2 K/uL   METABOLIC PANEL, COMPREHENSIVE    Collection Time: 01/26/19  4:51 AM   Result Value Ref Range    Sodium 142 136 - 145 mmol/L    Potassium 2.7 (L) 3.5 - 5.1 mmol/L    Chloride 109 (H) 98 - 107 mmol/L    CO2 24 21 - 32 mmol/L    Anion gap 9 7 - 16 mmol/L    Glucose 91 65 - 100 mg/dL    BUN 4 (L) 6 - 23 MG/DL    Creatinine 0.26 (L) 0.6 - 1.0 MG/DL    GFR est AA >60 >60 ml/min/1.69m    GFR est non-AA >60 >60 ml/min/1.734m   Calcium 8.1 (L) 8.3 - 10.4 MG/DL    Bilirubin, total 0.5 0.2 - 1.1 MG/DL    ALT (SGPT) 36 12 - 65 U/L     AST (SGOT) 37 15 - 37 U/L    Alk. phosphatase 175 (H) 50 - 136 U/L    Protein, total 5.5 (L) 6.3 - 8.2 g/dL    Albumin 2.5 (L) 3.5 - 5.0 g/dL    Globulin 3.0 2.3 - 3.5 g/dL  A-G Ratio 0.8 (L) 1.2 - 3.5     LD    Collection Time: 01/26/19  4:51 AM   Result Value Ref Range    LD 152 100 - 190 U/L   URIC ACID    Collection Time: 01/26/19  4:51 AM   Result Value Ref Range    Uric acid 3.6 2.6 - 6.0 MG/DL       Assessment:    Patient Active Problem List    Diagnosis Date Noted   ??? Pregnancy 01/25/2019   ??? Mild preeclampsia 01/23/2019   ??? Fall at home 01/22/2019   ??? Pruritus 12/27/2018   ??? Hypokalemia 11/06/2018   ??? Heart palpitations 11/06/2018   ??? Multigravida in third trimester 09/09/2018   ??? Asthma during pregnancy 09/09/2018   ??? History of pre-eclampsia in prior pregnancy, currently pregnant in third trimester 09/09/2018     LFTs are now normal. Hgb continues to drop, but LD is normal. BPs are also normal.  Pt had previous normal hgb electrophoresis.   K+ still low despite po K+    Plan:  Iv Fe complete. Steroids complete  Iv K today  Ck peripheral smear, folate, b12 level  Continue in house management until delivery.

## 2019-01-26 NOTE — Progress Notes (Signed)
[facility] High Risk Pregnancy Note  Debra Warren  [id number]    Patient admitted for elevated bp. Hx abruption and preE with prior preg. Has proteinuria so now with dx preE. GFM. No complaints at all.       Vitals:    Patient Vitals for the past 24 hrs:   BP Temp Pulse Resp   01/26/19 0830 137/83 99 ??F (37.2 ??C) 85 18   01/26/19 0454 (!) 122/57 98.1 ??F (36.7 ??C) 76 13   01/25/19 1958 118/75 98.3 ??F (36.8 ??C) 85 14   01/25/19 1707 129/84 98.5 ??F (36.9 ??C) 74 16     Temp (24hrs), Avg:98.5 ??F (36.9 ??C), Min:98.1 ??F (36.7 ??C), Max:99 ??F (37.2 ??C)    I&O:  No intake/output data recorded.            No intake/output data recorded.    Exam:  Patient without distress.               Abdomen soft, non-tender               Fundus soft and non tender                      Lower extremities edema 1+, pitting, shins               Patellar Reflexes normal per nurse                  Rnst this am with no ctx's and no decels    Labs:   Recent Results (from the past 24 hour(s))   CBC W/O DIFF    Collection Time: 01/26/19  4:51 AM   Result Value Ref Range    WBC 9.2 4.3 - 11.1 K/uL    RBC 3.53 (L) 4.05 - 5.2 M/uL    HGB 8.7 (L) 11.7 - 15.4 g/dL    HCT 27.3 (L) 35.8 - 46.3 %    MCV 77.3 (L) 79.6 - 97.8 FL    MCH 24.6 (L) 26.1 - 32.9 PG    MCHC 31.9 31.4 - 35.0 g/dL    RDW 14.3 11.9 - 14.6 %    PLATELET 216 150 - 450 K/uL    MPV 9.8 9.4 - 12.3 FL    ABSOLUTE NRBC 0.05 0.0 - 0.2 K/uL   METABOLIC PANEL, COMPREHENSIVE    Collection Time: 01/26/19  4:51 AM   Result Value Ref Range    Sodium 142 136 - 145 mmol/L    Potassium 2.7 (L) 3.5 - 5.1 mmol/L    Chloride 109 (H) 98 - 107 mmol/L    CO2 24 21 - 32 mmol/L    Anion gap 9 7 - 16 mmol/L    Glucose 91 65 - 100 mg/dL    BUN 4 (L) 6 - 23 MG/DL    Creatinine 0.26 (L) 0.6 - 1.0 MG/DL    GFR est AA >60 >60 ml/min/1.65m    GFR est non-AA >60 >60 ml/min/1.745m   Calcium 8.1 (L) 8.3 - 10.4 MG/DL    Bilirubin, total 0.5 0.2 - 1.1 MG/DL    ALT (SGPT) 36 12 - 65 U/L    AST  (SGOT) 37 15 - 37 U/L    Alk. phosphatase 175 (H) 50 - 136 U/L    Protein, total 5.5 (L) 6.3 - 8.2 g/dL    Albumin 2.5 (L) 3.5 - 5.0 g/dL    Globulin 3.0 2.3 - 3.5 g/dL  A-G Ratio 0.8 (L) 1.2 - 3.5     LD    Collection Time: 01/26/19  4:51 AM   Result Value Ref Range    LD 152 100 - 190 U/L   URIC ACID    Collection Time: 01/26/19  4:51 AM   Result Value Ref Range    Uric acid 3.6 2.6 - 6.0 MG/DL       Assessment:    Patient Active Problem List    Diagnosis Date Noted   ??? Pregnancy 01/25/2019   ??? Mild preeclampsia 01/23/2019   ??? Fall at home 01/22/2019   ??? Pruritus 12/27/2018   ??? Hypokalemia 11/06/2018   ??? Heart palpitations 11/06/2018   ??? Multigravida in third trimester 09/09/2018   ??? Asthma during pregnancy 09/09/2018   ??? History of pre-eclampsia in prior pregnancy, currently pregnant in third trimester 09/09/2018     LFTs are now normal. Hgb continues to drop, but LD is normal. BPs are also normal.  Pt had previous normal hgb electrophoresis.   K+ still low despite po K+    Plan:  Iv Fe complete. Steroids complete  Iv K today  Ck peripheral smear, folate, b12 level  Continue in house management until delivery.

## 2019-01-26 NOTE — Progress Notes (Signed)
01/26/19 1045   Fetal Vital Signs   Mode External   Fetal Heart Rate 140   Fetal Activity Present   Variability 6-25 BPM   Decelerations None   Accelerations Yes   RN Reviewed Strip? Yes   Non Stress Test Reactive   Uterine Activity   Mode External   Frequency (min) 0

## 2019-01-27 LAB — PATHOLOGIST REVIEW SMEARS

## 2019-01-27 LAB — VITAMIN B12
Vitamin B-12: 242 pg/mL (ref 193–986)
Vitamin B12: 242 pg/mL (ref 193–986)

## 2019-01-27 LAB — FOLATE
Folate: 16.5 ng/mL (ref 3.1–17.5)
Folate: 16.5 ng/mL (ref 3.1–17.5)

## 2019-01-27 MED ORDER — POTASSIUM CHLORIDE SR 20 MEQ TAB, PARTICLES/CRYSTALS
20 mEq | Freq: Three times a day (TID) | ORAL | Status: DC
Start: 2019-01-27 — End: 2019-02-04
  Administered 2019-01-27 – 2019-02-03 (×19): via ORAL

## 2019-01-27 MED ORDER — DOCUSATE SODIUM 100 MG CAP
100 mg | Freq: Every day | ORAL | Status: DC
Start: 2019-01-27 — End: 2019-02-03
  Administered 2019-01-27 – 2019-02-02 (×6): via ORAL

## 2019-01-27 MED FILL — POLYETHYLENE GLYCOL 3350 17 GRAM (100 %) ORAL POWDER PACKET: 17 gram | ORAL | Qty: 1

## 2019-01-27 MED FILL — FERROUS SULFATE 325 MG (65 MG ELEMENTAL IRON) TAB: 325 mg (65 mg iron) | ORAL | Qty: 1

## 2019-01-27 MED FILL — POTASSIUM CHLORIDE SR 20 MEQ TAB, PARTICLES/CRYSTALS: 20 mEq | ORAL | Qty: 2

## 2019-01-27 MED FILL — ASPIRIN 81 MG CHEWABLE TAB: 81 mg | ORAL | Qty: 2

## 2019-01-27 MED FILL — DOCUSATE SODIUM 100 MG CAP: 100 mg | ORAL | Qty: 1

## 2019-01-27 MED FILL — VITAMIN C 500 MG TABLET: 500 mg | ORAL | Qty: 1

## 2019-01-27 NOTE — Progress Notes (Signed)
01/27/19 1002   Fetal Vital Signs   Mode External   Fetal Heart Rate 140   Fetal Activity Present   Variability 6-25 BPM   Decelerations None   Accelerations Yes   RN Reviewed Strip? Yes   Non Stress Test Reactive   Uterine Activity   Mode External   Frequency (min) 0

## 2019-01-27 NOTE — Progress Notes (Signed)
Toco and cardio applied for NST.  Shift assessment complete.  Patient denies any headache, blurred vision, epigastric pain or contractions.  Patient reports good fetal movement.  Denies any needs or wants at this time.  Patient will call out PRN for any wants or needs, call light in reach.

## 2019-01-27 NOTE — Progress Notes (Signed)
Debra Warren  Is a G6 [phone] admitted for Dx below:    Patient Active Problem List   Diagnosis Code   ??? Multigravida in third trimester Z34.83   ??? Asthma during pregnancy O99.519, J45.909   ??? History of pre-eclampsia in prior pregnancy, currently pregnant in third trimester O09.293   ??? Hypokalemia E87.6   ??? Heart palpitations R00.2   ??? Pruritus L29.9   ??? Fall at home W19.XXXA, Y92.009   ??? Mild preeclampsia O14.00        Pt has no complaints today.  (+) FM, no ctx, LOF, VB. Denies any H/A, visual changes, abdom pain, N/V.    Visit Vitals  BP 139/73   Pulse 80   Temp 98.7 ??F (37.1 ??C)   Resp 14   SpO2 98%   Breastfeeding No       NST -- 150's, reactive, (+) FM, no ctx or decels    CV - RRR  Lungs - CT bilat  Abdom - gravid, soft, NT, NABS  Ext - no C/C/E    No results found for this or any previous visit (from the past 24 hour(s)).      Current Facility-Administered Medications:   ???  polyethylene glycol (MIRALAX) packet 17 g, 17 g, Oral, BID PRN, Aldean Baker, MD  ???  ascorbic acid (vitamin C) (VITAMIN C) tablet 500 mg, 500 mg, Oral, DAILY, Mazzoli, Vanessa A, MD  ???  diph,Pertuss(AC),Tet Vac-PF (BOOSTRIX) suspension 0.5 mL, 0.5 mL, IntraMUSCular, PRIOR TO DISCHARGE, Brittanny Levenhagen, Spero Geralds, MD  ???  ferrous sulfate tablet 325 mg, 1 Tab, Oral, DAILY, Mazzoli, Clydia Llano, MD, 325 mg at 01/26/19 6045  ???  potassium chloride (K-DUR, KLOR-CON) SR tablet 40 mEq, 40 mEq, Oral, BID, Oran Rein D, MD, 40 mEq at 01/26/19 2025  ???  aspirin chewable tablet 162 mg, 162 mg, Oral, DAILY, Army Melia, MD, 162 mg at 01/26/19 4098  ???  sodium chloride (NS) flush 5-40 mL, 5-40 mL, IntraVENous, Q8H, Oran Rein D, MD  ???  sodium chloride (NS) flush 5-40 mL, 5-40 mL, IntraVENous, PRN, Oran Rein D, MD, 10 mL at 01/26/19 2030  ???  acetaminophen (TYLENOL) tablet 650 mg, 650 mg, Oral, Q4H PRN, Charm Rings, MD, 650 mg at 01/25/19 2111    A/P:  1.  IUP @ [redacted]w[redacted]d with Estimated Date of Delivery: 02/23/19   2. Mild pre-eclampsia -- BPs and labs have normalized. No neuro sympt.  Will cont in-house mgmt per MFM recs  3. Hypokalemia -- PO and IV replacement.  Will follow    Aldean Baker, MD  8:46 AM  01/27/19

## 2019-01-27 NOTE — Progress Notes (Signed)
Patient care assumed from [name], RN . Report received.

## 2019-01-27 NOTE — Progress Notes (Signed)
01/27/19 1002   Fetal Vital Signs   Mode External   Fetal Heart Rate 140   Fetal Activity Present   Variability 6-25 BPM   Decelerations None   Accelerations Yes   RN Reviewed Strip? Yes   Non Stress Test Reactive   Uterine Activity   Mode External   Frequency (min) 0

## 2019-01-27 NOTE — Progress Notes (Signed)
Toco and cardio applied for NST.  Shift assessment complete.  Patient denies any headache, blurred vision, epigastric pain or contractions.  Patient reports good fetal movement.  Denies any needs or wants at this time.  Patient will call out PRN for any wants or needs, call light in reach.

## 2019-01-27 NOTE — Progress Notes (Signed)
Debra Warren  Is a G6 [id number] admitted for Dx below:    Patient Active Problem List   Diagnosis Code   ??? Multigravida in third trimester Z34.83   ??? Asthma during pregnancy O99.519, J45.909   ??? History of pre-eclampsia in prior pregnancy, currently pregnant in third trimester O09.293   ??? Hypokalemia E87.6   ??? Heart palpitations R00.2   ??? Pruritus L29.9   ??? Fall at home W19.XXXA, Y92.009   ??? Mild preeclampsia O14.00        Pt has no complaints today.  (+) FM, no ctx, LOF, VB. Denies any H/A, visual changes, abdom pain, N/V.    Visit Vitals  BP 139/73   Pulse 80   Temp 98.7 ??F (37.1 ??C)   Resp 14   SpO2 98%   Breastfeeding No       NST -- 150's, reactive, (+) FM, no ctx or decels    CV - RRR  Lungs - CT bilat  Abdom - gravid, soft, NT, NABS  Ext - no C/C/E    No results found for this or any previous visit (from the past 24 hour(s)).      Current Facility-Administered Medications:   ???  polyethylene glycol (MIRALAX) packet 17 g, 17 g, Oral, BID PRN, Aldean Baker, MD  ???  ascorbic acid (vitamin C) (VITAMIN C) tablet 500 mg, 500 mg, Oral, DAILY, Mazzoli, Vanessa A, MD  ???  diph,Pertuss(AC),Tet Vac-PF (BOOSTRIX) suspension 0.5 mL, 0.5 mL, IntraMUSCular, PRIOR TO DISCHARGE, Jamir Rone, Spero Geralds, MD  ???  ferrous sulfate tablet 325 mg, 1 Tab, Oral, DAILY, Mazzoli, Clydia Llano, MD, 325 mg at 01/26/19 6213  ???  potassium chloride (K-DUR, KLOR-CON) SR tablet 40 mEq, 40 mEq, Oral, BID, Oran Rein D, MD, 40 mEq at 01/26/19 2025  ???  aspirin chewable tablet 162 mg, 162 mg, Oral, DAILY, Army Melia, MD, 162 mg at 01/26/19 0865  ???  sodium chloride (NS) flush 5-40 mL, 5-40 mL, IntraVENous, Q8H, Oran Rein D, MD  ???  sodium chloride (NS) flush 5-40 mL, 5-40 mL, IntraVENous, PRN, Oran Rein D, MD, 10 mL at 01/26/19 2030  ???  acetaminophen (TYLENOL) tablet 650 mg, 650 mg, Oral, Q4H PRN, Charm Rings, MD, 650 mg at 01/25/19 2111    A/P:  1.  IUP @ [redacted]w[redacted]d with Estimated Date of Delivery: 02/23/19  2. Mild pre-eclampsia --  BPs and labs have normalized. No neuro sympt.  Will cont in-house mgmt per MFM recs  3. Hypokalemia -- PO and IV replacement.  Will follow    Aldean Baker, MD  8:46 AM  01/27/19

## 2019-01-28 ENCOUNTER — Inpatient Hospital Stay: Admit: 2019-01-28 | Payer: BLUE CROSS/BLUE SHIELD

## 2019-01-28 LAB — COMPREHENSIVE METABOLIC PANEL
ALT: 42 U/L (ref 12–65)
AST: 46 U/L — ABNORMAL HIGH (ref 15–37)
Albumin/Globulin Ratio: 0.9 — ABNORMAL LOW (ref 1.2–3.5)
Albumin: 2.6 g/dL — ABNORMAL LOW (ref 3.5–5.0)
Alkaline Phosphatase: 196 U/L — ABNORMAL HIGH (ref 50–136)
Anion Gap: 7 mmol/L (ref 7–16)
BUN: 5 MG/DL — ABNORMAL LOW (ref 6–23)
CO2: 24 mmol/L (ref 21–32)
Calcium: 8.6 MG/DL (ref 8.3–10.4)
Chloride: 109 mmol/L — ABNORMAL HIGH (ref 98–107)
Creatinine: 0.27 MG/DL — ABNORMAL LOW (ref 0.6–1.0)
EGFR IF NonAfrican American: >60 mL/min/{1.73_m2} (ref >60)
GFR African American: >60 mL/min/{1.73_m2} (ref >60)
Globulin: 2.9 g/dL (ref 2.3–3.5)
Glucose: 85 mg/dL (ref 65–100)
Potassium: 3.4 mmol/L — ABNORMAL LOW (ref 3.5–5.1)
Sodium: 140 mmol/L (ref 136–145)
Total Bilirubin: 0.6 MG/DL (ref 0.2–1.1)
Total Protein: 5.5 g/dL — ABNORMAL LOW (ref 6.3–8.2)

## 2019-01-28 LAB — CBC
Hematocrit: 28.7 % — ABNORMAL LOW (ref 35.8–46.3)
Hemoglobin: 9 g/dL — ABNORMAL LOW (ref 11.7–15.4)
MCH: 24.7 PG — ABNORMAL LOW (ref 26.1–32.9)
MCHC: 31.4 g/dL (ref 31.4–35.0)
MCV: 78.6 FL — ABNORMAL LOW (ref 79.6–97.8)
MPV: 10 FL (ref 9.4–12.3)
NRBC Absolute: 0.1 10*3/uL (ref 0.0–0.2)
Platelets: 222 10*3/uL (ref 150–450)
RBC: 3.65 M/uL — ABNORMAL LOW (ref 4.05–5.2)
RDW: 14.5 % (ref 11.9–14.6)
WBC: 10.1 10*3/uL (ref 4.3–11.1)

## 2019-01-28 LAB — CULTURE, GENITAL GROUP B STREP

## 2019-01-28 LAB — METABOLIC PANEL, COMPREHENSIVE
A-G Ratio: 0.9 — ABNORMAL LOW (ref 1.2–3.5)
ALT (SGPT): 42 U/L (ref 12–65)
AST (SGOT): 46 U/L — ABNORMAL HIGH (ref 15–37)
Albumin: 2.6 g/dL — ABNORMAL LOW (ref 3.5–5.0)
Alk. phosphatase: 196 U/L — ABNORMAL HIGH (ref 50–136)
Anion gap: 7 mmol/L (ref 7–16)
BUN: 5 MG/DL — ABNORMAL LOW (ref 6–23)
Bilirubin, total: 0.6 MG/DL (ref 0.2–1.1)
CO2: 24 mmol/L (ref 21–32)
Calcium: 8.6 MG/DL (ref 8.3–10.4)
Chloride: 109 mmol/L — ABNORMAL HIGH (ref 98–107)
Creatinine: 0.27 MG/DL — ABNORMAL LOW (ref 0.6–1.0)
GFR est AA: >60 mL/min/{1.73_m2} (ref >60)
GFR est non-AA: >60 mL/min/{1.73_m2} (ref >60)
Globulin: 2.9 g/dL (ref 2.3–3.5)
Glucose: 85 mg/dL (ref 65–100)
Potassium: 3.4 mmol/L — ABNORMAL LOW (ref 3.5–5.1)
Protein, total: 5.5 g/dL — ABNORMAL LOW (ref 6.3–8.2)
Sodium: 140 mmol/L (ref 136–145)

## 2019-01-28 LAB — CBC W/O DIFF
ABSOLUTE NRBC: 0.1 10*3/uL (ref 0.0–0.2)
HCT: 28.7 % — ABNORMAL LOW (ref 35.8–46.3)
HGB: 9 g/dL — ABNORMAL LOW (ref 11.7–15.4)
MCH: 24.7 PG — ABNORMAL LOW (ref 26.1–32.9)
MCHC: 31.4 g/dL (ref 31.4–35.0)
MCV: 78.6 FL — ABNORMAL LOW (ref 79.6–97.8)
MPV: 10 FL (ref 9.4–12.3)
PLATELET: 222 10*3/uL (ref 150–450)
RBC: 3.65 M/uL — ABNORMAL LOW (ref 4.05–5.2)
RDW: 14.5 % (ref 11.9–14.6)
WBC: 10.1 10*3/uL (ref 4.3–11.1)

## 2019-01-28 MED ORDER — FLU VACCINE QS 2020-21(6MOS+)(PF) 60 MCG(15 MCGX4)/0.5 ML IM SYRINGE
60 mcg (15 mcg x 4)/0.5 mL | INTRAMUSCULAR | Status: DC
Start: 2019-01-28 — End: 2019-02-03

## 2019-01-28 MED FILL — VITAMIN C 500 MG TABLET: 500 mg | ORAL | Qty: 1

## 2019-01-28 MED FILL — POTASSIUM CHLORIDE SR 20 MEQ TAB, PARTICLES/CRYSTALS: 20 mEq | ORAL | Qty: 2

## 2019-01-28 MED FILL — FERROUS SULFATE 325 MG (65 MG ELEMENTAL IRON) TAB: 325 mg (65 mg iron) | ORAL | Qty: 1

## 2019-01-28 MED FILL — FLUARIX QUAD 2020-2021 (PF) 60 MCG (15 MCG X 4)/0.5 ML IM SYRINGE: 60 mcg (15 mcg x 4)/0.5 mL | INTRAMUSCULAR | Qty: 0.5

## 2019-01-28 NOTE — Progress Notes (Signed)
Patient with eyes closed no complaints.

## 2019-01-28 NOTE — Progress Notes (Signed)
Clean linens to bed.  Room picked up fresh water pitcher with ice taken to bedside.  Patient denies any needs at this time, will call out prn.  Lights dimmed.

## 2019-01-28 NOTE — Progress Notes (Signed)
AM assessment completed. Pt states positive fetal movement. Denies any pain / headache / dizziness / needs. Ordering breakfast. Pt informed of POC.

## 2019-01-28 NOTE — Progress Notes (Signed)
Report received from A.Lawson,RN. Care assumed.

## 2019-01-28 NOTE — Progress Notes (Signed)
Pt sitting in chair at bedside. Denies any needs / pain.

## 2019-01-28 NOTE — Progress Notes (Signed)
Shift assessment complete.  Patient denies any headaches, blurred vision,epigastric pain or contractions.  Patient reports good fetal movement.  Toco and cardio applied for NST.  Patient to call out for any wants or needs.  Call light within reach.

## 2019-01-28 NOTE — Progress Notes (Signed)
Dr. Beaudoin updated on BPP 8/8. Order received for no NST this am. AM meds given. Pt encouraged to call RN with any needs / pain. Verbalizes understanding.

## 2019-01-28 NOTE — Progress Notes (Signed)
Comprehensive Nutrition Assessment    Type and Reason for Visit: Initial  LOS    Nutrition Recommendations/Plan:   ? Continue with current diet     Nutrition Assessment:   Nutrition History:    Cultural/Religious/Ethnic Food Preference(s): None   Nutrition Background: Pt presented after fall at home and history of preeclampsia. Currently is [redacted]w[redacted]d and plan is to remain inpatient until delivery. Has had preeclampsia with prior 2 pregnancies  Daily Update:  Pt reports that she has been eating well through out this pregnancy and gaining appropriate weight. She reports pre-pregnancy weight at 174 meaning she has gained 18 lbs to this point. She also plans to breast feed and understands the increased needs to maintain a good milk supply. Pt has no other questions or concerns at this time.     Nutrition Related Findings:   None      Current Nutrition Therapies:  DIET REGULAR    Current Intake:   Average Meal Intake: 76-100% Average Supplement Intake: None ordered      Anthropometric Measures:  Height: 5' (152.4 cm)  Current Body Wt: 87.1 kg (192 lb 0.3 oz), Weight source: Not specified  BMI: 37.5, Obese class 2 (BMI 35.0-39.9)     Ideal Body Wt: 100 lbs (45 kg), 192 %  Usual Body Wt: 78.9 kg (174 lb), Percent weight change: 10.4          Estimated Daily Nutrient Needs:  Energy (kcal/day): 2272 kcal/day (YNW*2.9+562) (3rd trimester) (Mifflin-St. Jeor, Weight Used: Current)  Protein (g/day): 96 g/day (1.1g/kg) Weight Used: (Current)  Fluid (ml/day):   (1 ml/kcal)    Nutrition Diagnosis:   ?? No nutrition diagnosis at this time      Nutrition Interventions:   Food and/or Nutrient Delivery: Continue current diet          Goals:    Active Goal: Intake >75% of nutritional needs over 7 days    Nutrition Monitoring and Evaluation:      Food/Nutrient Intake Outcomes: Food and nutrient intake       Discharge Planning:    No discharge needs at this time    Electronically signed by Silverio Lay MS, RD, LD on 01/28/2019 at 1:31 PM.   Contact: [phone]    Disaster Mode active

## 2019-01-28 NOTE — Progress Notes (Signed)
Resting in chair. Denies any needs at present.

## 2019-01-28 NOTE — Progress Notes (Signed)
High Risk Obstetrics Progress Note    Name: Debra Warren MRN: [id number]  SSN: [id number]    Date of Birth: 03-Mar-1990  Age: 28 y.o.  Sex: female      Subjective:      LOS: 3 days    Estimated Date of Delivery: 02/23/19   Gestational Age Today: [redacted]w[redacted]d    Patient admitted for preeclampsia. States she has no complaints today. Denies HA, CB, SOB, RUQ pain or visual changes. Sitting at bedside in chair eating dinner at present.     Objective:     Vitals:  Blood pressure 129/67, pulse 83, temperature 99.1 ??F (37.3 ??C), resp. rate 18, height 5' (1.524 m), last menstrual period 05/19/2018, SpO2 98 %, not currently breastfeeding.   Temp (24hrs), Avg:98.7 ??F (37.1 ??C), Min:98 ??F (36.7 ??C), Max:99.1 ??F (37.3 ??C)    Systolic (257DUK, AGUR:427, Min:129 , MCWC:376     Diastolic (228BTD, AVVO:16 Min:67, Max:81       Intake and Output:         Physical Exam:  Patient without distress.  Heart: Regular rate  Lung: normal respiratory effort  Abdomen: soft, nontender, gravid  Fundus: soft and non tender  Cervical Exam: Deferred without sx  Lower Extremities:  No pitting edema. No CT       Membranes:  Intact without sx    Uterine Activity:  None    Fetal Heart Rate:  Reactive        Labs:   Recent Results (from the past 36 hour(s))   CBC W/O DIFF    Collection Time: 01/28/19  6:35 AM   Result Value Ref Range    WBC 10.1 4.3 - 11.1 K/uL    RBC 3.65 (L) 4.05 - 5.2 M/uL    HGB 9.0 (L) 11.7 - 15.4 g/dL    HCT 28.7 (L) 35.8 - 46.3 %    MCV 78.6 (L) 79.6 - 97.8 FL    MCH 24.7 (L) 26.1 - 32.9 PG    MCHC 31.4 31.4 - 35.0 g/dL    RDW 14.5 11.9 - 14.6 %    PLATELET 222 150 - 450 K/uL    MPV 10.0 9.4 - 12.3 FL    ABSOLUTE NRBC 0.10 0.0 - 0.2 K/uL   METABOLIC PANEL, COMPREHENSIVE    Collection Time: 01/28/19  6:35 AM   Result Value Ref Range    Sodium 140 136 - 145 mmol/L    Potassium 3.4 (L) 3.5 - 5.1 mmol/L    Chloride 109 (H) 98 - 107 mmol/L    CO2 24 21 - 32 mmol/L    Anion gap 7 7 - 16 mmol/L    Glucose 85 65 - 100 mg/dL     BUN 5 (L) 6 - 23 MG/DL    Creatinine 0.27 (L) 0.6 - 1.0 MG/DL    GFR est AA >60 >60 ml/min/1.784m   GFR est non-AA >60 >60 ml/min/1.7361m  Calcium 8.6 8.3 - 10.4 MG/DL    Bilirubin, total 0.6 0.2 - 1.1 MG/DL    ALT (SGPT) 42 12 - 65 U/L    AST (SGOT) 46 (H) 15 - 37 U/L    Alk. phosphatase 196 (H) 50 - 136 U/L    Protein, total 5.5 (L) 6.3 - 8.2 g/dL    Albumin 2.6 (L) 3.5 - 5.0 g/dL    Globulin 2.9 2.3 - 3.5 g/dL    A-G Ratio 0.9 (L) 1.2 - 3.5  Assessment and Plan:      Principal Problem:    Mild preeclampsia (01/23/2019)      Overview: [facility]- Mild preE with significant history and mild transaminitis bump,       remain inpt with close surveillance. Not a candidate for home management       at this time.     Active Problems:    Multigravida in third trimester (09/09/2018)      Overview: EDD 02/23/2019 by LMP c/w 16wk [place]            09/10/2018: AFP, CF, SMA negative      12/17/2018: Flu vaccine/TDAP recommended, Plans breastfeeding, desires BTL      History of pre-eclampsia in prior pregnancy, currently pregnant in third trimester (09/09/2018)      Overview: Hx of preeclampsia (requiring delivery at 36 weeks) and a Hx of postpartum       preeclampsia      Baseline pre-e labs/urine, Start ASA 162 mg daily            09/11/2018: 24 hr urine protein 158      Fall at home (01/22/2019)       Preeclampsia: No severe sx at this time. Planning delivery at 37 wks unless indication prior. BPP today 8/8.

## 2019-01-28 NOTE — Progress Notes (Signed)
Clean linens to bed.  Room picked up fresh water pitcher with ice taken to bedside.  Patient denies any needs at this time, will call out prn.  Lights dimmed.

## 2019-01-28 NOTE — Progress Notes (Signed)
Resting in chair. Denies any needs at present.

## 2019-01-28 NOTE — Progress Notes (Signed)
High Risk Obstetrics Progress Note    Name: Debra Warren MRN: [id number]  SSN: [id number]    Date of Birth: 1990/12/19  Age: 28 y.o.  Sex: female      Subjective:      LOS: 3 days    Estimated Date of Delivery: 02/23/19   Gestational Age Today: [redacted]w[redacted]d    Patient admitted for preeclampsia. States she has no complaints today. Denies HA, CB, SOB, RUQ pain or visual changes. Sitting at bedside in chair eating dinner at present.     Objective:     Vitals:  Blood pressure 129/67, pulse 83, temperature 99.1 ??F (37.3 ??C), resp. rate 18, height 5' (1.524 m), last menstrual period 05/19/2018, SpO2 98 %, not currently breastfeeding.   Temp (24hrs), Avg:98.7 ??F (37.1 ??C), Min:98 ??F (36.7 ??C), Max:99.1 ??F (37.3 ??C)    Systolic (277OEU, AMPN:361, Min:129 , MWER:154     Diastolic (200QQP, AYPP:50 Min:67, Max:81       Intake and Output:         Physical Exam:  Patient without distress.  Heart: Regular rate  Lung: normal respiratory effort  Abdomen: soft, nontender, gravid  Fundus: soft and non tender  Cervical Exam: Deferred without sx  Lower Extremities:  No pitting edema. No CT       Membranes:  Intact without sx    Uterine Activity:  None    Fetal Heart Rate:  Reactive        Labs:   Recent Results (from the past 36 hour(s))   CBC W/O DIFF    Collection Time: 01/28/19  6:35 AM   Result Value Ref Range    WBC 10.1 4.3 - 11.1 K/uL    RBC 3.65 (L) 4.05 - 5.2 M/uL    HGB 9.0 (L) 11.7 - 15.4 g/dL    HCT 28.7 (L) 35.8 - 46.3 %    MCV 78.6 (L) 79.6 - 97.8 FL    MCH 24.7 (L) 26.1 - 32.9 PG    MCHC 31.4 31.4 - 35.0 g/dL    RDW 14.5 11.9 - 14.6 %    PLATELET 222 150 - 450 K/uL    MPV 10.0 9.4 - 12.3 FL    ABSOLUTE NRBC 0.10 0.0 - 0.2 K/uL   METABOLIC PANEL, COMPREHENSIVE    Collection Time: 01/28/19  6:35 AM   Result Value Ref Range    Sodium 140 136 - 145 mmol/L    Potassium 3.4 (L) 3.5 - 5.1 mmol/L    Chloride 109 (H) 98 - 107 mmol/L    CO2 24 21 - 32 mmol/L    Anion gap 7 7 - 16 mmol/L    Glucose 85 65 - 100 mg/dL    BUN 5 (L) 6 - 23  MG/DL    Creatinine 0.27 (L) 0.6 - 1.0 MG/DL    GFR est AA >60 >60 ml/min/1.752m   GFR est non-AA >60 >60 ml/min/1.7359m  Calcium 8.6 8.3 - 10.4 MG/DL    Bilirubin, total 0.6 0.2 - 1.1 MG/DL    ALT (SGPT) 42 12 - 65 U/L    AST (SGOT) 46 (H) 15 - 37 U/L    Alk. phosphatase 196 (H) 50 - 136 U/L    Protein, total 5.5 (L) 6.3 - 8.2 g/dL    Albumin 2.6 (L) 3.5 - 5.0 g/dL    Globulin 2.9 2.3 - 3.5 g/dL    A-G Ratio 0.9 (L) 1.2 - 3.5  Assessment and Plan:      Principal Problem:    Mild preeclampsia (01/23/2019)      Overview: [facility]- Mild preE with significant history and mild transaminitis bump,       remain inpt with close surveillance. Not a candidate for home management       at this time.     Active Problems:    Multigravida in third trimester (09/09/2018)      Overview: EDD 02/23/2019 by LMP c/w 16wk [place]            09/10/2018: AFP, CF, SMA negative      12/17/2018: Flu vaccine/TDAP recommended, Plans breastfeeding, desires BTL      History of pre-eclampsia in prior pregnancy, currently pregnant in third trimester (09/09/2018)      Overview: Hx of preeclampsia (requiring delivery at 36 weeks) and a Hx of postpartum       preeclampsia      Baseline pre-e labs/urine, Start ASA 162 mg daily            09/11/2018: 24 hr urine protein 158      Fall at home (01/22/2019)       Preeclampsia: No severe sx at this time. Planning delivery at 37 wks unless indication prior. BPP today 8/8.

## 2019-01-28 NOTE — Progress Notes (Signed)
Report received from A.Lawson,RN. Care assumed.

## 2019-01-28 NOTE — Progress Notes (Signed)
Pt sitting in chair at bedside. Denies any needs / pain.

## 2019-01-28 NOTE — Progress Notes (Signed)
AM assessment completed. Pt states positive fetal movement. Denies any pain / headache / dizziness / needs. Ordering breakfast. Pt informed of POC.

## 2019-01-28 NOTE — Progress Notes (Signed)
 Comprehensive Nutrition Assessment    Type and Reason for Visit: Initial  LOS    Nutrition Recommendations/Plan:   . Continue with current diet     Nutrition Assessment:   Nutrition History:    Cultural/Religious/Ethnic Food Preference(s): None   Nutrition Background: Pt presented after fall at home and history of preeclampsia. Currently is [redacted]w[redacted]d and plan is to remain inpatient until delivery. Has had preeclampsia with prior 2 pregnancies  Daily Update:  Pt reports that she has been eating well through out this pregnancy and gaining appropriate weight. She reports pre-pregnancy weight at 174 meaning she has gained 18 lbs to this point. She also plans to breast feed and understands the increased needs to maintain a good milk supply. Pt has no other questions or concerns at this time.     Nutrition Related Findings:   None      Current Nutrition Therapies:  DIET REGULAR    Current Intake:   Average Meal Intake: 76-100% Average Supplement Intake: None ordered      Anthropometric Measures:  Height: 5' (152.4 cm)  Current Body Wt: 87.1 kg (192 lb 0.3 oz), Weight source: Not specified  BMI: 37.5, Obese class 2 (BMI 35.0-39.9)     Ideal Body Wt: 100 lbs (45 kg), 192 %  Usual Body Wt: 78.9 kg (174 lb), Percent weight change: 10.4          Estimated Daily Nutrient Needs:  Energy (kcal/day): 2272 kcal/day (FDG*8.7+547) (3rd trimester) (Mifflin-St. Jeor, Weight Used: Current)  Protein (g/day): 96 g/day (1.1g/kg) Weight Used: (Current)  Fluid (ml/day):   (1 ml/kcal)    Nutrition Diagnosis:    No nutrition diagnosis at this time      Nutrition Interventions:   Food and/or Nutrient Delivery: Continue current diet          Goals:    Active Goal: Intake >75% of nutritional needs over 7 days    Nutrition Monitoring and Evaluation:      Food/Nutrient Intake Outcomes: Food and nutrient intake       Discharge Planning:    No discharge needs at this time    Electronically signed by Mardy Jernigan MS, RD, LD on 01/28/2019 at 1:31  PM.  Contact: [phone]    Disaster Mode active

## 2019-01-28 NOTE — Progress Notes (Signed)
Shift assessment complete.  Patient denies any headaches, blurred vision,epigastric pain or contractions.  Patient reports good fetal movement.  Toco and cardio applied for NST.  Patient to call out for any wants or needs.  Call light within reach.

## 2019-01-28 NOTE — Progress Notes (Signed)
Dr. Sharilyn Sites updated on BPP 8/8. Order received for no NST this am. AM meds given. Pt encouraged to call RN with any needs / pain. Verbalizes understanding.

## 2019-01-29 MED FILL — VITAMIN C 500 MG TABLET: 500 mg | ORAL | Qty: 1

## 2019-01-29 MED FILL — POTASSIUM CHLORIDE SR 20 MEQ TAB, PARTICLES/CRYSTALS: 20 mEq | ORAL | Qty: 2

## 2019-01-29 MED FILL — FERROUS SULFATE 325 MG (65 MG ELEMENTAL IRON) TAB: 325 mg (65 mg iron) | ORAL | Qty: 1

## 2019-01-29 MED FILL — DOCUSATE SODIUM 100 MG CAP: 100 mg | ORAL | Qty: 1

## 2019-01-29 NOTE — Progress Notes (Signed)
Patient with eyes closed no complaints.

## 2019-01-29 NOTE — Progress Notes (Signed)
High Risk Obstetrics Progress Note    Name: Debra Warren MRN: [id number]  SSN: [id number]    Date of Birth: 07/19/1990  Age: 28 y.o.  Sex: female      Subjective:      LOS: 4 days    Estimated Date of Delivery: 02/23/19   Gestational Age Today: [redacted]w[redacted]d     Patient admitted for a fall and found to have elevated BP. States she is feeling well. She denies headache, vision changes, ruq pain. She said she had mild swelling yesterday in her feet, but it has since resolved.     Objective:     Vitals:    Patient Vitals for the past 24 hrs:   Temp Pulse Resp BP   01/28/19 2111 98 ??F (36.7 ??C) 79 18 139/83   01/28/19 1626 ??? 83 ??? 129/67   01/28/19 1242 99.1 ??F (37.3 ??C) 97 18 137/71       Physical Exam:  Patient without distress.  Abdomen: soft, nontender  Fundus: soft and non tender  Lower Extremities:  - Edema trace   - Clonus: absent       Membranes:  Intact    Uterine Activity:  rare    Fetal Heart Rate:  Reactive NST. One small variable with a contraction, not duplicated with other contractions.         Labs:   CBC:    Recent Labs     01/28/19  0635 01/26/19  0451 01/24/19  0942 01/23/19  0707 01/22/19  1950 12/17/18  1010 10/28/18  1748 09/10/18  1044   WBC 10.1 9.2 10.0 8.4 8.9 9.6 10.4 6.9   HGB 9.0* 8.7* 9.2* 9.6* 10.1* 11.0* 12.0 12.0   HCT 28.7* 27.3* 28.7* 29.6* 31.1* 33.2* 35.6* 34.5   PLT 222 216 224 229 245 218 241 216       CMP:   Recent Labs     01/28/19  0635 01/26/19  0451 01/24/19  0942 01/23/19  0707 01/22/19  1950 12/27/18  1145 12/27/18  0943 12/17/18  1010 11/06/18  0837 10/28/18  1748 09/10/18  1044   NA 140 142 141 142 139  --  143 143 139 139 137   K 3.4* 2.7* 2.5* 2.6* 2.4*  --  3.5 3.6 3.6 2.8* 3.7   CL 109* 109* 108* 109* 105  --  107* 107* 105 106 104   CO2 24 24 23 24 25   --  21 21 18* 24 20   AGAP 7 9 10 9 9   --   --   --   --  9  --    GLU 85 91 170* 104* 74 81 174* 161* 103* 95 86   BUN 5* 4* 3* 3* 3*  --  5* 4* 6 4* 6   CREA 0.27* 0.26* 0.39* 0.36* 0.40*  --  0.40* 0.37* 0.41* 0.44* 0.46*    GFRAA >60 >60 >60 >60 >60  --  163 167 161 >60 157   GFRNA >60 >60 >60 >60 >60  --  141 145 140 >60 136   CA 8.6 8.1* 8.2* 8.8 8.5  --  9.2 9.0 9.3 9.1 9.2   MG  --   --   --   --   --   --   --   --  1.7  --   --    ALB 2.6* 2.5* 2.9* 2.9* 3.1*  --  3.7* 3.7* 3.7* 3.0* 4.0   TP  5.5* 5.5* 6.0* 6.4 6.8  --  5.7* 5.9* 5.9* 6.8 6.1   GLOB 2.9 3.0 3.1 3.5 3.7*  --   --   --   --  3.8*  --    AGRAT 0.9* 0.8* 0.9* 0.8* 0.8*  --  1.9 1.7 1.7 0.8* 1.9   ALT 42 36 39 43 47  --  13 14 26  36 9       Recent Labs     01/26/19  0451 01/24/19  0942 01/22/19  2205 01/22/19  1950 09/10/18  1044   URICA 3.6 3.9  --  4.8 3.9   LDH 152 174  --   --  130   PUQ  --   --  363  --   --        COAGS:  No results for input(s): APTT, PTP, INR, INREXT in the last 7224 hours.    Invalid input(s): PTTP, INRT    Recent Glucose Results: Recent Glucose Results:   Recent Labs     01/28/19  0635 01/26/19  0451 01/24/19  0942 01/23/19  0707 01/22/19  1950 12/27/18  1145 12/27/18  0943 12/17/18  1010 11/06/18  0837 10/28/18  1748 09/10/18  1044   GLU 85 91 170* 104* 74 81 174* 161* 103* 95 86         Assessment and Plan:      Principal Problem:    Mild preeclampsia (01/23/2019)      Overview: [facility]- Mild preE with significant history and mild transaminitis bump,       remain inpt with close surveillance. Not a candidate for home management       at this time.     Active Problems:    Multigravida in third trimester (09/09/2018)      Overview: EDD 02/23/2019 by LMP c/w 16wk [place]            09/10/2018: AFP, CF, SMA negative      12/17/2018: Flu vaccine/TDAP recommended, Plans breastfeeding, desires BTL      History of pre-eclampsia in prior pregnancy, currently pregnant in third trimester (09/09/2018)      Overview: Hx of preeclampsia (requiring delivery at 36 weeks) and a Hx of postpartum       preeclampsia      Baseline pre-e labs/urine, Start ASA 162 mg daily            09/11/2018: 24 hr urine protein 158      Fall at home (01/22/2019)        Plan is for delivery at 37 wks unless fetal/maternal status changes.  BPs all normal except one in past 48 hours.    Army Melia, MD  11:43 AM

## 2019-01-29 NOTE — Progress Notes (Signed)
Chart reviewed - patient admitted with preeclampsia.  Per chart, patient will remain in hospital until delivery at 37 weeks.    SW met with patient while social distancing w/appropriate PPE.      Patient states that she's coping well with current hospitalization.  She has three other children that are currently in the care of her husband.  Patient denies any history of depression/anxiety.    Patient denied any needs from social worker.    SW will continue to follow.    Elizabeth Z. McKinney, LISW-CP  [facility]

## 2019-01-29 NOTE — Progress Notes (Signed)
NST reactive, see flowsheet for details. Pt requesting tylenol for headache and planning to take a shower. Sig other now at bedside.

## 2019-01-29 NOTE — Progress Notes (Signed)
Shift assessment complete. Monitors applied for shift NST. Pt reports mild headache when asked. "Mostly annoying" denies need for medication at this time. Reports appropriate FM. No vag bleeding or discharge, no painful contractions.

## 2019-01-29 NOTE — Progress Notes (Signed)
Chart reviewed - patient admitted with preeclampsia.  Per chart, patient will remain in hospital until delivery at 37 weeks.    SW met with patient while social distancing w/appropriate PPE.      Patient states that she's coping well with current hospitalization.  She has three other children that are currently in the care of her husband.  Patient denies any history of depression/anxiety.    Patient denied any needs from Child psychotherapist.    SW will continue to follow.    Leta Speller, LISW-CP  [facility]

## 2019-01-29 NOTE — Progress Notes (Signed)
NST reactive, see flowsheet for details. Pt requesting tylenol for headache and planning to take a shower. Sig other now at bedside.

## 2019-01-29 NOTE — Progress Notes (Signed)
High Risk Obstetrics Progress Note    Name: Debra Warren MRN: [id number]  SSN: [id number]    Date of Birth: 09/28/1990  Age: 28 y.o.  Sex: female      Subjective:      LOS: 4 days    Estimated Date of Delivery: 02/23/19   Gestational Age Today: [redacted]w[redacted]d     Patient admitted for a fall and found to have elevated BP. States she is feeling well. She denies headache, vision changes, ruq pain. She said she had mild swelling yesterday in her feet, but it has since resolved.     Objective:     Vitals:    Patient Vitals for the past 24 hrs:   Temp Pulse Resp BP   01/28/19 2111 98 ??F (36.7 ??C) 79 18 139/83   01/28/19 1626 ??? 83 ??? 129/67   01/28/19 1242 99.1 ??F (37.3 ??C) 97 18 137/71       Physical Exam:  Patient without distress.  Abdomen: soft, nontender  Fundus: soft and non tender  Lower Extremities:  - Edema trace   - Clonus: absent       Membranes:  Intact    Uterine Activity:  rare    Fetal Heart Rate:  Reactive NST. One small variable with a contraction, not duplicated with other contractions.         Labs:   CBC:    Recent Labs     01/28/19  0635 01/26/19  0451 01/24/19  0942 01/23/19  0707 01/22/19  1950 12/17/18  1010 10/28/18  1748 09/10/18  1044   WBC 10.1 9.2 10.0 8.4 8.9 9.6 10.4 6.9   HGB 9.0* 8.7* 9.2* 9.6* 10.1* 11.0* 12.0 12.0   HCT 28.7* 27.3* 28.7* 29.6* 31.1* 33.2* 35.6* 34.5   PLT 222 216 224 229 245 218 241 216       CMP:   Recent Labs     01/28/19  0635 01/26/19  0451 01/24/19  0942 01/23/19  0707 01/22/19  1950 12/27/18  1145 12/27/18  0943 12/17/18  1010 11/06/18  0837 10/28/18  1748 09/10/18  1044   NA 140 142 141 142 139  --  143 143 139 139 137   K 3.4* 2.7* 2.5* 2.6* 2.4*  --  3.5 3.6 3.6 2.8* 3.7   CL 109* 109* 108* 109* 105  --  107* 107* 105 106 104   CO2 24 24 23 24 25   --  21 21 18* 24 20   AGAP 7 9 10 9 9   --   --   --   --  9  --    GLU 85 91 170* 104* 74 81 174* 161* 103* 95 86   BUN 5* 4* 3* 3* 3*  --  5* 4* 6 4* 6   CREA 0.27* 0.26* 0.39* 0.36* 0.40*  --  0.40* 0.37* 0.41* 0.44* 0.46*    GFRAA >60 >60 >60 >60 >60  --  163 167 161 >60 157   GFRNA >60 >60 >60 >60 >60  --  141 145 140 >60 136   CA 8.6 8.1* 8.2* 8.8 8.5  --  9.2 9.0 9.3 9.1 9.2   MG  --   --   --   --   --   --   --   --  1.7  --   --    ALB 2.6* 2.5* 2.9* 2.9* 3.1*  --  3.7* 3.7* 3.7* 3.0* 4.0   TP  5.5* 5.5* 6.0* 6.4 6.8  --  5.7* 5.9* 5.9* 6.8 6.1   GLOB 2.9 3.0 3.1 3.5 3.7*  --   --   --   --  3.8*  --    AGRAT 0.9* 0.8* 0.9* 0.8* 0.8*  --  1.9 1.7 1.7 0.8* 1.9   ALT 42 36 39 43 47  --  13 14 26  36 9       Recent Labs     01/26/19  0451 01/24/19  0942 01/22/19  2205 01/22/19  1950 09/10/18  1044   URICA 3.6 3.9  --  4.8 3.9   LDH 152 174  --   --  130   PUQ  --   --  363  --   --        COAGS:  No results for input(s): APTT, PTP, INR, INREXT in the last 7224 hours.    Invalid input(s): PTTP, INRT    Recent Glucose Results: Recent Glucose Results:   Recent Labs     01/28/19  0635 01/26/19  0451 01/24/19  0942 01/23/19  0707 01/22/19  1950 12/27/18  1145 12/27/18  0943 12/17/18  1010 11/06/18  0837 10/28/18  1748 09/10/18  1044   GLU 85 91 170* 104* 74 81 174* 161* 103* 95 86         Assessment and Plan:      Principal Problem:    Mild preeclampsia (01/23/2019)      Overview: [facility]- Mild preE with significant history and mild transaminitis bump,       remain inpt with close surveillance. Not a candidate for home management       at this time.     Active Problems:    Multigravida in third trimester (09/09/2018)      Overview: EDD 02/23/2019 by LMP c/w 16wk [place]            09/10/2018: AFP, CF, SMA negative      12/17/2018: Flu vaccine/TDAP recommended, Plans breastfeeding, desires BTL      History of pre-eclampsia in prior pregnancy, currently pregnant in third trimester (09/09/2018)      Overview: Hx of preeclampsia (requiring delivery at 36 weeks) and a Hx of postpartum       preeclampsia      Baseline pre-e labs/urine, Start ASA 162 mg daily            09/11/2018: 24 hr urine protein 158      Fall at home (01/22/2019)       Plan is for delivery  at 37 wks unless fetal/maternal status changes.  BPs all normal except one in past 48 hours.    Army Melia, MD  11:43 AM

## 2019-01-29 NOTE — Progress Notes (Signed)
Shift assessment complete. Monitors applied for shift NST. Pt reports mild headache when asked. "Mostly annoying" denies need for medication at this time. Reports appropriate FM. No vag bleeding or discharge, no painful contractions.

## 2019-01-30 MED FILL — DOCUSATE SODIUM 100 MG CAP: 100 mg | ORAL | Qty: 1

## 2019-01-30 MED FILL — POTASSIUM CHLORIDE SR 20 MEQ TAB, PARTICLES/CRYSTALS: 20 mEq | ORAL | Qty: 2

## 2019-01-30 MED FILL — FERROUS SULFATE 325 MG (65 MG ELEMENTAL IRON) TAB: 325 mg (65 mg iron) | ORAL | Qty: 1

## 2019-01-30 MED FILL — MAPAP (ACETAMINOPHEN) 325 MG TABLET: 325 mg | ORAL | Qty: 2

## 2019-01-30 MED FILL — VITAMIN C 500 MG TABLET: 500 mg | ORAL | Qty: 1

## 2019-01-30 NOTE — Progress Notes (Signed)
PIV attempt x2 by multiple RN, unsuccessful. Patient VSS with no complaints. Will reassess at later time or PRN.    NST completed

## 2019-01-30 NOTE — Progress Notes (Signed)
Report received from Margaret Craft RN. Pt care assumed.

## 2019-01-30 NOTE — Progress Notes (Signed)
Report given to Danielle Wolfe RN. Pt care relinquished.

## 2019-01-30 NOTE — Progress Notes (Signed)
Attempted to flush IV site. IV leaking heavily. Pt c/o of discomfort at IV site. IV removed.

## 2019-01-30 NOTE — Progress Notes (Signed)
Debra Warren  Is a G6 [phone] admitted for Dx below:    Patient Active Problem List   Diagnosis Code   ??? Multigravida in third trimester Z34.83   ??? Asthma during pregnancy O99.519, J45.909   ??? History of pre-eclampsia in prior pregnancy, currently pregnant in third trimester O09.293   ??? Hypokalemia E87.6   ??? Heart palpitations R00.2   ??? Pruritus L29.9   ??? Fall at home W19.XXXA, Y92.009   ??? Mild preeclampsia O14.00        Pt has no complaints today.  (+) FM, no ctx, LOF, VB.  Denies any H/A visual changes, abdom pain, N/V.    Patient Vitals for the past 24 hrs:   Temp Pulse Resp BP   01/30/19 0545 ??? 86 ??? 132/78   01/29/19 2125 99.4 ??F (37.4 ??C) 88 14 (!) 141/90   01/29/19 1333 ??? 99 ??? 135/76   01/29/19 0908 97.8 ??F (36.6 ??C) (!) 104 18 127/72       NST -- 140's, reactive, (+) FM, occ ctx, no decels    CV - RRR  Lungs - CT bilat  Abdom - gravid, soft, NT, NABS  Ext - no C/C/E    No results found for this or any previous visit (from the past 24 hour(s)).      Current Facility-Administered Medications:   ???  influenza vaccine 2020-21 (6 mos+)(PF) (FLUARIX/FLULAVAL/FLUZONE QUAD) injection 0.5 mL, 0.5 mL, IntraMUSCular, PRIOR TO DISCHARGE, Kashius Dominic, Spero Geralds, MD  ???  potassium chloride (K-DUR, KLOR-CON) SR tablet 40 mEq, 40 mEq, Oral, TID, Jonny Ruiz, MD, 40 mEq at 01/29/19 2122  ???  docusate sodium (COLACE) capsule 100 mg, 100 mg, Oral, DAILY, Aldean Baker, MD, 100 mg at 01/29/19 3810  ???  polyethylene glycol (MIRALAX) packet 17 g, 17 g, Oral, BID PRN, Aldean Baker, MD, 17 g at 01/27/19 0902  ???  ascorbic acid (vitamin C) (VITAMIN C) tablet 500 mg, 500 mg, Oral, DAILY, Mazzoli, Clydia Llano, MD, Stopped at 01/29/19 1751  ???  diph,Pertuss(AC),Tet Vac-PF (BOOSTRIX) suspension 0.5 mL, 0.5 mL, IntraMUSCular, PRIOR TO DISCHARGE, Kentrell Hallahan, Spero Geralds, MD  ???  ferrous sulfate tablet 325 mg, 1 Tab, Oral, DAILY, Mazzoli, Clydia Llano, MD, 325 mg at 01/29/19 0258   ???  sodium chloride (NS) flush 5-40 mL, 5-40 mL, IntraVENous, Q8H, Oran Rein D, MD, 10 mL at 01/29/19 1336  ???  sodium chloride (NS) flush 5-40 mL, 5-40 mL, IntraVENous, PRN, Charm Rings, MD, 10 mL at 01/28/19 5277  ???  acetaminophen (TYLENOL) tablet 650 mg, 650 mg, Oral, Q4H PRN, Charm Rings, MD, 650 mg at 01/29/19 2250    A/P:  1.  IUP @ [redacted]w[redacted]d with Estimated Date of Delivery: 02/23/19  2. Mild pre-eclampsia -- BPs stable, no neuro sympt, will recheck labs in am    PLAN:  Cont in-house mgmt with delivery at 37 weeks or sooner for deteriorating clinical condition    Aldean Baker, MD  8:23 AM  01/30/19

## 2019-01-30 NOTE — Progress Notes (Signed)
SBAR received from Danielle RN. Patient care assumed

## 2019-01-30 NOTE — Progress Notes (Signed)
Debra Warren  Is a G6 [phone] admitted for Dx below:    Patient Active Problem List   Diagnosis Code   ??? Multigravida in third trimester Z34.83   ??? Asthma during pregnancy O99.519, J45.909   ??? History of pre-eclampsia in prior pregnancy, currently pregnant in third trimester O09.293   ??? Hypokalemia E87.6   ??? Heart palpitations R00.2   ??? Pruritus L29.9   ??? Fall at home W19.XXXA, Y92.009   ??? Mild preeclampsia O14.00        Pt has no complaints today.  (+) FM, no ctx, LOF, VB.  Denies any H/A visual changes, abdom pain, N/V.    Patient Vitals for the past 24 hrs:   Temp Pulse Resp BP   01/30/19 0545 ??? 86 ??? 132/78   01/29/19 2125 99.4 ??F (37.4 ??C) 88 14 (!) 141/90   01/29/19 1333 ??? 99 ??? 135/76   01/29/19 0908 97.8 ??F (36.6 ??C) (!) 104 18 127/72       NST -- 140's, reactive, (+) FM, occ ctx, no decels    CV - RRR  Lungs - CT bilat  Abdom - gravid, soft, NT, NABS  Ext - no C/C/E    No results found for this or any previous visit (from the past 24 hour(s)).      Current Facility-Administered Medications:   ???  influenza vaccine 2020-21 (6 mos+)(PF) (FLUARIX/FLULAVAL/FLUZONE QUAD) injection 0.5 mL, 0.5 mL, IntraMUSCular, PRIOR TO DISCHARGE, Alexzandra Bilton, Spero Geralds, MD  ???  potassium chloride (K-DUR, KLOR-CON) SR tablet 40 mEq, 40 mEq, Oral, TID, Jonny Ruiz, MD, 40 mEq at 01/29/19 2122  ???  docusate sodium (COLACE) capsule 100 mg, 100 mg, Oral, DAILY, Aldean Baker, MD, 100 mg at 01/29/19 0867  ???  polyethylene glycol (MIRALAX) packet 17 g, 17 g, Oral, BID PRN, Aldean Baker, MD, 17 g at 01/27/19 0902  ???  ascorbic acid (vitamin C) (VITAMIN C) tablet 500 mg, 500 mg, Oral, DAILY, Mazzoli, Clydia Llano, MD, Stopped at 01/29/19 6195  ???  diph,Pertuss(AC),Tet Vac-PF (BOOSTRIX) suspension 0.5 mL, 0.5 mL, IntraMUSCular, PRIOR TO DISCHARGE, Cadon Raczka, Spero Geralds, MD  ???  ferrous sulfate tablet 325 mg, 1 Tab, Oral, DAILY, Mazzoli, Clydia Llano, MD, 325 mg at 01/29/19 0932  ???  sodium chloride (NS) flush 5-40 mL, 5-40 mL,  IntraVENous, Q8H, Oran Rein D, MD, 10 mL at 01/29/19 1336  ???  sodium chloride (NS) flush 5-40 mL, 5-40 mL, IntraVENous, PRN, Charm Rings, MD, 10 mL at 01/28/19 6712  ???  acetaminophen (TYLENOL) tablet 650 mg, 650 mg, Oral, Q4H PRN, Charm Rings, MD, 650 mg at 01/29/19 2250    A/P:  1.  IUP @ [redacted]w[redacted]d with Estimated Date of Delivery: 02/23/19  2. Mild pre-eclampsia -- BPs stable, no neuro sympt, will recheck labs in am    PLAN:  Cont in-house mgmt with delivery at 37 weeks or sooner for deteriorating clinical condition    Aldean Baker, MD  8:23 AM  01/30/19

## 2019-01-30 NOTE — Progress Notes (Signed)
PIV attempt x2 by multiple RN, unsuccessful. Patient VSS with no complaints. Will reassess at later time or PRN.    NST completed

## 2019-01-30 NOTE — Progress Notes (Signed)
Attempted to flush IV site. IV leaking heavily. Pt c/o of discomfort at IV site. IV removed.

## 2019-01-30 NOTE — Progress Notes (Signed)
Report given to Patsy Lager RN. Pt care relinquished.

## 2019-01-31 LAB — COMPREHENSIVE METABOLIC PANEL
ALT: 48 U/L (ref 12–65)
AST: 56 U/L — ABNORMAL HIGH (ref 15–37)
Albumin/Globulin Ratio: 0.9 — ABNORMAL LOW (ref 1.2–3.5)
Albumin: 2.8 g/dL — ABNORMAL LOW (ref 3.5–5.0)
Alkaline Phosphatase: 239 U/L — ABNORMAL HIGH (ref 50–130)
Anion Gap: 7 mmol/L (ref 7–16)
BUN: 5 MG/DL — ABNORMAL LOW (ref 6–23)
CO2: 24 mmol/L (ref 21–32)
Calcium: 8.8 MG/DL (ref 8.3–10.4)
Chloride: 107 mmol/L (ref 98–107)
Creatinine: 0.38 MG/DL — ABNORMAL LOW (ref 0.6–1.0)
EGFR IF NonAfrican American: >60 mL/min/{1.73_m2} (ref >60)
GFR African American: >60 mL/min/{1.73_m2} (ref >60)
Globulin: 3.2 g/dL (ref 2.3–3.5)
Glucose: 114 mg/dL — ABNORMAL HIGH (ref 65–100)
Potassium: 3.4 mmol/L — ABNORMAL LOW (ref 3.5–5.1)
Sodium: 138 mmol/L (ref 136–145)
Total Bilirubin: 0.8 MG/DL (ref 0.2–1.1)
Total Protein: 6 g/dL — ABNORMAL LOW (ref 6.3–8.2)

## 2019-01-31 LAB — CBC
Hematocrit: 32 % — ABNORMAL LOW (ref 35.8–46.3)
Hemoglobin: 10.2 g/dL — ABNORMAL LOW (ref 11.7–15.4)
MCH: 25.6 PG — ABNORMAL LOW (ref 26.1–32.9)
MCHC: 31.9 g/dL (ref 31.4–35.0)
MCV: 80.4 FL (ref 79.6–97.8)
MPV: 9.9 FL (ref 9.4–12.3)
NRBC Absolute: 0.04 10*3/uL (ref 0.0–0.2)
Platelets: 216 10*3/uL (ref 150–450)
RBC: 3.98 M/uL — ABNORMAL LOW (ref 4.05–5.2)
RDW: 17.2 % — ABNORMAL HIGH (ref 11.9–14.6)
WBC: 11.3 10*3/uL — ABNORMAL HIGH (ref 4.3–11.1)

## 2019-01-31 LAB — CBC W/O DIFF
ABSOLUTE NRBC: 0.04 10*3/uL (ref 0.0–0.2)
HCT: 32 % — ABNORMAL LOW (ref 35.8–46.3)
HGB: 10.2 g/dL — ABNORMAL LOW (ref 11.7–15.4)
MCH: 25.6 PG — ABNORMAL LOW (ref 26.1–32.9)
MCHC: 31.9 g/dL (ref 31.4–35.0)
MCV: 80.4 FL (ref 79.6–97.8)
MPV: 9.9 FL (ref 9.4–12.3)
PLATELET: 216 10*3/uL (ref 150–450)
RBC: 3.98 M/uL — ABNORMAL LOW (ref 4.05–5.2)
RDW: 17.2 % — ABNORMAL HIGH (ref 11.9–14.6)
WBC: 11.3 10*3/uL — ABNORMAL HIGH (ref 4.3–11.1)

## 2019-01-31 LAB — METABOLIC PANEL, COMPREHENSIVE
A-G Ratio: 0.9 — ABNORMAL LOW (ref 1.2–3.5)
ALT (SGPT): 48 U/L (ref 12–65)
AST (SGOT): 56 U/L — ABNORMAL HIGH (ref 15–37)
Albumin: 2.8 g/dL — ABNORMAL LOW (ref 3.5–5.0)
Alk. phosphatase: 239 U/L — ABNORMAL HIGH (ref 50–130)
Anion gap: 7 mmol/L (ref 7–16)
BUN: 5 MG/DL — ABNORMAL LOW (ref 6–23)
Bilirubin, total: 0.8 MG/DL (ref 0.2–1.1)
CO2: 24 mmol/L (ref 21–32)
Calcium: 8.8 MG/DL (ref 8.3–10.4)
Chloride: 107 mmol/L (ref 98–107)
Creatinine: 0.38 MG/DL — ABNORMAL LOW (ref 0.6–1.0)
GFR est AA: >60 mL/min/{1.73_m2} (ref >60)
GFR est non-AA: >60 mL/min/{1.73_m2} (ref >60)
Globulin: 3.2 g/dL (ref 2.3–3.5)
Glucose: 114 mg/dL — ABNORMAL HIGH (ref 65–100)
Potassium: 3.4 mmol/L — ABNORMAL LOW (ref 3.5–5.1)
Protein, total: 6 g/dL — ABNORMAL LOW (ref 6.3–8.2)
Sodium: 138 mmol/L (ref 136–145)

## 2019-01-31 MED FILL — FERROUS SULFATE 325 MG (65 MG ELEMENTAL IRON) TAB: 325 mg (65 mg iron) | ORAL | Qty: 1

## 2019-01-31 MED FILL — POTASSIUM CHLORIDE SR 20 MEQ TAB, PARTICLES/CRYSTALS: 20 mEq | ORAL | Qty: 2

## 2019-01-31 MED FILL — DOCUSATE SODIUM 100 MG CAP: 100 mg | ORAL | Qty: 1

## 2019-01-31 MED FILL — VITAMIN C 500 MG TABLET: 500 mg | ORAL | Qty: 1

## 2019-01-31 NOTE — Progress Notes (Signed)
SBAR report received from B. Williams RN, care assumed.

## 2019-01-31 NOTE — Progress Notes (Signed)
Denies needs, encouraged to call with needs. Pt verbalizes understanding.

## 2019-01-31 NOTE — Progress Notes (Signed)
SBAR report to A. Aukerman RN, care relinquished.

## 2019-01-31 NOTE — Progress Notes (Signed)
SBAR given to Amber RN. Patient care relinquished.   RN aware of no IV access

## 2019-01-31 NOTE — Progress Notes (Signed)
High Risk Obstetrics Progress Note    Name: Debra Warren MRN: [id number]  SSN: [id number]    Date of Birth: Feb 26, 1990  Age: 28 y.o.  Sex: female      Subjective:      LOS: 6 days    Estimated Date of Delivery: 02/23/19   Gestational Age Today: [redacted]w[redacted]d    Patient admitted for preeclampsia. States she does not have abdominal pain  , contractions, headache , nausea and vomiting, right upper quadrant pain  , vaginal bleeding , vaginal leaking of fluid  and visual disturbances. Spouse at bedside.    Objective:     Vitals:  Blood pressure 139/75, pulse 97, temperature 98 ??F (36.7 ??C), resp. rate 18, height 5' (1.524 m), last menstrual period 05/19/2018, SpO2 98 %, not currently breastfeeding.   Temp (24hrs), Avg:98.1 ??F (36.7 ??C), Min:97.9 ??F (36.6 ??C), Max:98.4 ??F (36.9 ??C)    Systolic (270JJK, AKXF:818, Min:123 , MEXH:371     Diastolic (269CVE, ALFY:10 Min:66, Max:89       Physical Exam:  Patient without distress.       Membranes:  Intact    Uterine Activity:  None    Fetal Heart Rate:  Reactive        Labs:   Recent Results (from the past 36 hour(s))   CBC W/O DIFF    Collection Time: 01/31/19 10:16 AM   Result Value Ref Range    WBC 11.3 (H) 4.3 - 11.1 K/uL    RBC 3.98 (L) 4.05 - 5.2 M/uL    HGB 10.2 (L) 11.7 - 15.4 g/dL    HCT 32.0 (L) 35.8 - 46.3 %    MCV 80.4 79.6 - 97.8 FL    MCH 25.6 (L) 26.1 - 32.9 PG    MCHC 31.9 31.4 - 35.0 g/dL    RDW 17.2 (H) 11.9 - 14.6 %    PLATELET 216 150 - 450 K/uL    MPV 9.9 9.4 - 12.3 FL    ABSOLUTE NRBC 0.04 0.0 - 0.2 K/uL   METABOLIC PANEL, COMPREHENSIVE    Collection Time: 01/31/19 10:16 AM   Result Value Ref Range    Sodium 138 136 - 145 mmol/L    Potassium 3.4 (L) 3.5 - 5.1 mmol/L    Chloride 107 98 - 107 mmol/L    CO2 24 21 - 32 mmol/L    Anion gap 7 7 - 16 mmol/L    Glucose 114 (H) 65 - 100 mg/dL    BUN 5 (L) 6 - 23 MG/DL    Creatinine 0.38 (L) 0.6 - 1.0 MG/DL    GFR est AA >60 >60 ml/min/1.787m   GFR est non-AA >60 >60 ml/min/1.7328m  Calcium 8.8 8.3 - 10.4 MG/DL     Bilirubin, total 0.8 0.2 - 1.1 MG/DL    ALT (SGPT) 48 12 - 65 U/L    AST (SGOT) 56 (H) 15 - 37 U/L    Alk. phosphatase 239 (H) 50 - 130 U/L    Protein, total 6.0 (L) 6.3 - 8.2 g/dL    Albumin 2.8 (L) 3.5 - 5.0 g/dL    Globulin 3.2 2.3 - 3.5 g/dL    A-G Ratio 0.9 (L) 1.2 - 3.5         Assessment and Plan:      Principal Problem:    Mild preeclampsia (01/23/2019)      Overview: [facility]- Mild preE with significant history and mild transaminitis bump,  remain inpt with close surveillance. Not a candidate for home management       at this time.     Active Problems:    Multigravida in third trimester (09/09/2018)      Overview: EDD 02/23/2019 by LMP c/w 16wk [place]            09/10/2018: AFP, CF, SMA negative      12/17/2018: Flu vaccine/TDAP recommended, Plans breastfeeding, desires BTL      History of pre-eclampsia in prior pregnancy, currently pregnant in third trimester (09/09/2018)      Overview: Hx of preeclampsia (requiring delivery at 36 weeks) and a Hx of postpartum       preeclampsia      Baseline pre-e labs/urine, Start ASA 162 mg daily            09/11/2018: 24 hr urine protein 158      Fall at home (01/22/2019)       Preeclampsia:  mild  , plan induction at 37 weeks.  Was transverse at admission, will plan to re-eval with bedside US tomorrow.

## 2019-01-31 NOTE — Progress Notes (Signed)
Pt requesting to finish eating breakfast before she takes her morning medication and has her NST.

## 2019-01-31 NOTE — Progress Notes (Signed)
Dr. Heidtman at [facility], POC discussed, pt verbalizes understanding. Denies needs.

## 2019-01-31 NOTE — Progress Notes (Signed)
SBAR from A.Freeman RN

## 2019-01-31 NOTE — Progress Notes (Signed)
Denies needs.

## 2019-01-31 NOTE — Progress Notes (Signed)
Denies needs, encouraged to call with needs. Pt verbalizes understanding.

## 2019-01-31 NOTE — Progress Notes (Signed)
SBAR report to A. Alla Feeling RN, care relinquished.

## 2019-01-31 NOTE — Progress Notes (Signed)
High Risk Obstetrics Progress Note    Name: Debra Warren MRN: [id number]  SSN: [id number]    Date of Birth: 1990-03-26  Age: 28 y.o.  Sex: female      Subjective:      LOS: 6 days    Estimated Date of Delivery: 02/23/19   Gestational Age Today: [redacted]w[redacted]d    Patient admitted for preeclampsia. States she does not have abdominal pain  , contractions, headache , nausea and vomiting, right upper quadrant pain  , vaginal bleeding , vaginal leaking of fluid  and visual disturbances. Spouse at bedside.    Objective:     Vitals:  Blood pressure 139/75, pulse 97, temperature 98 ??F (36.7 ??C), resp. rate 18, height 5' (1.524 m), last menstrual period 05/19/2018, SpO2 98 %, not currently breastfeeding.   Temp (24hrs), Avg:98.1 ??F (36.7 ??C), Min:97.9 ??F (36.6 ??C), Max:98.4 ??F (36.9 ??C)    Systolic (285IOE, AVOJ:500, Min:123 , MXFG:182     Diastolic (299BZJ, AIRC:78 Min:66, Max:89       Physical Exam:  Patient without distress.       Membranes:  Intact    Uterine Activity:  None    Fetal Heart Rate:  Reactive        Labs:   Recent Results (from the past 36 hour(s))   CBC W/O DIFF    Collection Time: 01/31/19 10:16 AM   Result Value Ref Range    WBC 11.3 (H) 4.3 - 11.1 K/uL    RBC 3.98 (L) 4.05 - 5.2 M/uL    HGB 10.2 (L) 11.7 - 15.4 g/dL    HCT 32.0 (L) 35.8 - 46.3 %    MCV 80.4 79.6 - 97.8 FL    MCH 25.6 (L) 26.1 - 32.9 PG    MCHC 31.9 31.4 - 35.0 g/dL    RDW 17.2 (H) 11.9 - 14.6 %    PLATELET 216 150 - 450 K/uL    MPV 9.9 9.4 - 12.3 FL    ABSOLUTE NRBC 0.04 0.0 - 0.2 K/uL   METABOLIC PANEL, COMPREHENSIVE    Collection Time: 01/31/19 10:16 AM   Result Value Ref Range    Sodium 138 136 - 145 mmol/L    Potassium 3.4 (L) 3.5 - 5.1 mmol/L    Chloride 107 98 - 107 mmol/L    CO2 24 21 - 32 mmol/L    Anion gap 7 7 - 16 mmol/L    Glucose 114 (H) 65 - 100 mg/dL    BUN 5 (L) 6 - 23 MG/DL    Creatinine 0.38 (L) 0.6 - 1.0 MG/DL    GFR est AA >60 >60 ml/min/1.717m   GFR est non-AA >60 >60 ml/min/1.7342m  Calcium 8.8 8.3 - 10.4 MG/DL    Bilirubin,  total 0.8 0.2 - 1.1 MG/DL    ALT (SGPT) 48 12 - 65 U/L    AST (SGOT) 56 (H) 15 - 37 U/L    Alk. phosphatase 239 (H) 50 - 130 U/L    Protein, total 6.0 (L) 6.3 - 8.2 g/dL    Albumin 2.8 (L) 3.5 - 5.0 g/dL    Globulin 3.2 2.3 - 3.5 g/dL    A-G Ratio 0.9 (L) 1.2 - 3.5         Assessment and Plan:      Principal Problem:    Mild preeclampsia (01/23/2019)      Overview: [facility]- Mild preE with significant history and mild transaminitis bump,  remain inpt with close surveillance. Not a candidate for home management       at this time.     Active Problems:    Multigravida in third trimester (09/09/2018)      Overview: EDD 02/23/2019 by LMP c/w 16wk [place]            09/10/2018: AFP, CF, SMA negative      12/17/2018: Flu vaccine/TDAP recommended, Plans breastfeeding, desires BTL      History of pre-eclampsia in prior pregnancy, currently pregnant in third trimester (09/09/2018)      Overview: Hx of preeclampsia (requiring delivery at 36 weeks) and a Hx of postpartum       preeclampsia      Baseline pre-e labs/urine, Start ASA 162 mg daily            09/11/2018: 24 hr urine protein 158      Fall at home (01/22/2019)       Preeclampsia:  mild  , plan induction at 37 weeks.  Was transverse at admission, will plan to re-eval with bedside US tomorrow.

## 2019-01-31 NOTE — Progress Notes (Signed)
SBAR from A.Neale Burly RN

## 2019-01-31 NOTE — Progress Notes (Signed)
SBAR given to [employer]. Patient care relinquished.   RN aware of no IV access

## 2019-01-31 NOTE — Progress Notes (Signed)
Pt requesting to finish eating breakfast before she takes her morning medication and has her NST.

## 2019-01-31 NOTE — Progress Notes (Signed)
Dr. Sheran Spine at [facility], POC discussed, pt verbalizes understanding. Denies needs.

## 2019-02-01 MED FILL — DOCUSATE SODIUM 100 MG CAP: 100 mg | ORAL | Qty: 1

## 2019-02-01 MED FILL — POTASSIUM CHLORIDE SR 20 MEQ TAB, PARTICLES/CRYSTALS: 20 mEq | ORAL | Qty: 2

## 2019-02-01 MED FILL — VITAMIN C 500 MG TABLET: 500 mg | ORAL | Qty: 1

## 2019-02-01 MED FILL — FERROUS SULFATE 325 MG (65 MG ELEMENTAL IRON) TAB: 325 mg (65 mg iron) | ORAL | Qty: 1

## 2019-02-01 NOTE — Progress Notes (Signed)
SBAR to M.Clement RN

## 2019-02-01 NOTE — Progress Notes (Addendum)
TOCO and EFM removed.

## 2019-02-01 NOTE — Progress Notes (Signed)
Dr Heidtman here to assess pt. Ultrasound baby is now vertex. poc to be induced tomorrow night.

## 2019-02-01 NOTE — Progress Notes (Signed)
High Risk Obstetrics Progress Note    Name: Debra Warren MRN: [id number]  SSN: [id number]    Date of Birth: 06/02/1990  Age: 28 y.o.  Sex: female      Subjective:      LOS: 7 days    Estimated Date of Delivery: 02/23/19   Gestational Age Today: [redacted]w[redacted]d     Patient admitted for preeclampsia. States she does have normal fetal movement and does not have abdominal pain  , contractions, headache , nausea and vomiting, right upper quadrant pain  , shortness of breath, swelling, vaginal bleeding , vaginal leaking of fluid  and visual disturbances.    Objective:     Vitals:  Blood pressure 137/83, pulse (!) 112, temperature 97.8 ??F (36.6 ??C), resp. rate 18, height 5' (1.524 m), last menstrual period 05/19/2018, SpO2 98 %, not currently breastfeeding.   Temp (24hrs), Avg:97.9 ??F (36.6 ??C), Min:97.8 ??F (36.6 ??C), Max:98 ??F (36.7 ??C)    Systolic (24hrs), Avg:135 , Min:127 , Max:139      Diastolic (24hrs), Avg:79, Min:75, Max:83       Intake and Output:         Physical Exam:  Patient without distress. A&Ox3  Neuro / Psych grossly WNL  Abdomen: soft, nontender, nondistended, without guarding, without rebound  Fundus: soft and non tender  Lower Extremities:  - Edema No   - No evidence of DVT seen on physical exam.   - Patellar Reflexes: 1+ bilaterally       Membranes:  Intact    Uterine Activity:  None    Fetal Heart Rate:  Reactive      Limited bedside US shows the baby to be in cephalic position.      Labs:   Recent Results (from the past 36 hour(s))   CBC W/O DIFF    Collection Time: 01/31/19 10:16 AM   Result Value Ref Range    WBC 11.3 (H) 4.3 - 11.1 K/uL    RBC 3.98 (L) 4.05 - 5.2 M/uL    HGB 10.2 (L) 11.7 - 15.4 g/dL    HCT 32.0 (L) 35.8 - 46.3 %    MCV 80.4 79.6 - 97.8 FL    MCH 25.6 (L) 26.1 - 32.9 PG    MCHC 31.9 31.4 - 35.0 g/dL    RDW 17.2 (H) 11.9 - 14.6 %    PLATELET 216 150 - 450 K/uL    MPV 9.9 9.4 - 12.3 FL    ABSOLUTE NRBC 0.04 0.0 - 0.2 K/uL   METABOLIC PANEL, COMPREHENSIVE    Collection Time: 01/31/19 10:16 AM    Result Value Ref Range    Sodium 138 136 - 145 mmol/L    Potassium 3.4 (L) 3.5 - 5.1 mmol/L    Chloride 107 98 - 107 mmol/L    CO2 24 21 - 32 mmol/L    Anion gap 7 7 - 16 mmol/L    Glucose 114 (H) 65 - 100 mg/dL    BUN 5 (L) 6 - 23 MG/DL    Creatinine 0.38 (L) 0.6 - 1.0 MG/DL    GFR est AA >60 >60 ml/min/1.73m2    GFR est non-AA >60 >60 ml/min/1.73m2    Calcium 8.8 8.3 - 10.4 MG/DL    Bilirubin, total 0.8 0.2 - 1.1 MG/DL    ALT (SGPT) 48 12 - 65 U/L    AST (SGOT) 56 (H) 15 - 37 U/L    Alk. phosphatase 239 (H) 50 - 130 U/L      Protein, total 6.0 (L) 6.3 - 8.2 g/dL    Albumin 2.8 (L) 3.5 - 5.0 g/dL    Globulin 3.2 2.3 - 3.5 g/dL    A-G Ratio 0.9 (L) 1.2 - 3.5         Assessment and Plan:      Principal Problem:    Mild preeclampsia (01/23/2019)      Overview: [facility]- Mild preE with significant history and mild transaminitis bump,       remain inpt with close surveillance. Not a candidate for home management       at this time.     Active Problems:    Multigravida in third trimester (09/09/2018)      Overview: EDD 02/23/2019 by LMP c/w 16wk [place]            09/10/2018: AFP, CF, SMA negative      12/17/2018: Flu vaccine/TDAP recommended, Plans breastfeeding, desires BTL      History of pre-eclampsia in prior pregnancy, currently pregnant in third trimester (09/09/2018)      Overview: Hx of preeclampsia (requiring delivery at 36 weeks) and a Hx of postpartum       preeclampsia      Baseline pre-e labs/urine, Start ASA 162 mg daily            09/11/2018: 24 hr urine protein 158      Fall at home (01/22/2019)       Mild PreE - plan for induction tomorrow or tomorrow night

## 2019-02-01 NOTE — Progress Notes (Signed)
RN to bedside. Shift assessment complete. TOCO and US applied to soft non-tender abd. Pt denies any concerns and complaints. Encouraged to call out PRN. Voiced understanding.

## 2019-02-01 NOTE — Progress Notes (Signed)
Dr Sheran Spine here to assess pt. Ultrasound baby is now vertex. poc to be induced tomorrow night.

## 2019-02-01 NOTE — Progress Notes (Signed)
High Risk Obstetrics Progress Note    Name: Debra Warren MRN: [id number]  SSN: [id number]    Date of Birth: [birth year]  Age: 28 y.o.  Sex: female      Subjective:      LOS: 7 days    Estimated Date of Delivery: 02/23/19   Gestational Age Today: [redacted]w[redacted]d    Patient admitted for preeclampsia. States she does have normal fetal movement and does not have abdominal pain  , contractions, headache , nausea and vomiting, right upper quadrant pain  , shortness of breath, swelling, vaginal bleeding , vaginal leaking of fluid  and visual disturbances.    Objective:     Vitals:  Blood pressure 137/83, pulse (!) 112, temperature 97.8 ??F (36.6 ??C), resp. rate 18, height 5' (1.524 m), last menstrual period 05/19/2018, SpO2 98 %, not currently breastfeeding.   Temp (24hrs), Avg:97.9 ??F (36.6 ??C), Min:97.8 ??F (36.6 ??C), Max:98 ??F (36.7 ??C)    Systolic (251OAC, AZYS:063, Min:127 , MKZS:010     Diastolic (293ATF, ATDD:22 Min:75, Max:83       Intake and Output:         Physical Exam:  Patient without distress. A&Ox3  Neuro / Psych grossly WNL  Abdomen: soft, nontender, nondistended, without guarding, without rebound  Fundus: soft and non tender  Lower Extremities:  - Edema No   - No evidence of DVT seen on physical exam.   - Patellar Reflexes: 1+ bilaterally       Membranes:  Intact    Uterine Activity:  None    Fetal Heart Rate:  Reactive      Limited bedside [place] the baby to be in cephalic position.      Labs:   Recent Results (from the past 36 hour(s))   CBC W/O DIFF    Collection Time: 01/31/19 10:16 AM   Result Value Ref Range    WBC 11.3 (H) 4.3 - 11.1 K/uL    RBC 3.98 (L) 4.05 - 5.2 M/uL    HGB 10.2 (L) 11.7 - 15.4 g/dL    HCT 32.0 (L) 35.8 - 46.3 %    MCV 80.4 79.6 - 97.8 FL    MCH 25.6 (L) 26.1 - 32.9 PG    MCHC 31.9 31.4 - 35.0 g/dL    RDW 17.2 (H) 11.9 - 14.6 %    PLATELET 216 150 - 450 K/uL    MPV 9.9 9.4 - 12.3 FL    ABSOLUTE NRBC 0.04 0.0 - 0.2 K/uL   METABOLIC PANEL, COMPREHENSIVE    Collection Time: 01/31/19 10:16 AM    Result Value Ref Range    Sodium 138 136 - 145 mmol/L    Potassium 3.4 (L) 3.5 - 5.1 mmol/L    Chloride 107 98 - 107 mmol/L    CO2 24 21 - 32 mmol/L    Anion gap 7 7 - 16 mmol/L    Glucose 114 (H) 65 - 100 mg/dL    BUN 5 (L) 6 - 23 MG/DL    Creatinine 0.38 (L) 0.6 - 1.0 MG/DL    GFR est AA >60 >60 ml/min/1.735m   GFR est non-AA >60 >60 ml/min/1.7348m  Calcium 8.8 8.3 - 10.4 MG/DL    Bilirubin, total 0.8 0.2 - 1.1 MG/DL    ALT (SGPT) 48 12 - 65 U/L    AST (SGOT) 56 (H) 15 - 37 U/L    Alk. phosphatase 239 (H) 50 - 130 U/L  Protein, total 6.0 (L) 6.3 - 8.2 g/dL    Albumin 2.8 (L) 3.5 - 5.0 g/dL    Globulin 3.2 2.3 - 3.5 g/dL    A-G Ratio 0.9 (L) 1.2 - 3.5         Assessment and Plan:      Principal Problem:    Mild preeclampsia (01/23/2019)      Overview: [facility]- Mild preE with significant history and mild transaminitis bump,       remain inpt with close surveillance. Not a candidate for home management       at this time.     Active Problems:    Multigravida in third trimester (09/09/2018)      Overview: EDD 02/23/2019 by LMP c/w 16wk [place]            09/10/2018: AFP, CF, SMA negative      12/17/2018: Flu vaccine/TDAP recommended, Plans breastfeeding, desires BTL      History of pre-eclampsia in prior pregnancy, currently pregnant in third trimester (09/09/2018)      Overview: Hx of preeclampsia (requiring delivery at 36 weeks) and a Hx of postpartum       preeclampsia      Baseline pre-e labs/urine, Start ASA 162 mg daily            09/11/2018: 24 hr urine protein 158      Fall at home (01/22/2019)       Mild PreE - plan for induction tomorrow or tomorrow night

## 2019-02-01 NOTE — Progress Notes (Signed)
RN to bedside. Shift assessment complete. TOCO and [place] applied to soft non-tender abd. Pt denies any concerns and complaints. Encouraged to call out PRN. Voiced understanding.

## 2019-02-01 NOTE — Progress Notes (Signed)
TOCO and EFM removed.

## 2019-02-02 LAB — CBC
Hematocrit: 39.6 % (ref 35.8–46.3)
Hemoglobin: 12.5 g/dL (ref 11.7–15.4)
MCH: 25.5 PG — ABNORMAL LOW (ref 26.1–32.9)
MCHC: 31.6 g/dL (ref 31.4–35.0)
MCV: 80.8 FL (ref 79.6–97.8)
MPV: 10.5 FL (ref 9.4–12.3)
NRBC Absolute: 0.02 10*3/uL (ref 0.0–0.2)
Platelets: 258 10*3/uL (ref 150–450)
RBC: 4.9 M/uL (ref 4.05–5.2)
RDW: 18.6 % — ABNORMAL HIGH (ref 11.9–14.6)
WBC: 13.2 10*3/uL — ABNORMAL HIGH (ref 4.3–11.1)

## 2019-02-02 LAB — TYPE AND SCREEN
ABO/Rh: O POS
Antibody Screen: NEGATIVE

## 2019-02-02 LAB — TYPE & SCREEN
ABO/Rh(D): O POS
Antibody screen: NEGATIVE

## 2019-02-02 LAB — CBC W/O DIFF
ABSOLUTE NRBC: 0.02 10*3/uL (ref 0.0–0.2)
HCT: 39.6 % (ref 35.8–46.3)
HGB: 12.5 g/dL (ref 11.7–15.4)
MCH: 25.5 PG — ABNORMAL LOW (ref 26.1–32.9)
MCHC: 31.6 g/dL (ref 31.4–35.0)
MCV: 80.8 FL (ref 79.6–97.8)
MPV: 10.5 FL (ref 9.4–12.3)
PLATELET: 258 10*3/uL (ref 150–450)
RBC: 4.9 M/uL (ref 4.05–5.2)
RDW: 18.6 % — ABNORMAL HIGH (ref 11.9–14.6)
WBC: 13.2 10*3/uL — ABNORMAL HIGH (ref 4.3–11.1)

## 2019-02-02 MED ORDER — DEXTROSE 5%-LACTATED RINGERS IV
INTRAVENOUS | Status: DC
Start: 2019-02-02 — End: 2019-02-03
  Administered 2019-02-03: 11:00:00 via INTRAVENOUS

## 2019-02-02 MED ORDER — MISOPROSTOL 100 MCG TAB
100 mcg | Freq: Four times a day (QID) | ORAL | Status: DC
Start: 2019-02-02 — End: 2019-02-03
  Administered 2019-02-02 (×2): via BUCCAL

## 2019-02-02 MED ORDER — OXYTOCIN 30 UNIT IN 500 ML INFUSION
30 unit/500 mL | INTRAVENOUS | Status: AC | PRN
Start: 2019-02-02 — End: 2019-02-03
  Administered 2019-02-03: 22:00:00 via INTRAVENOUS

## 2019-02-02 MED ORDER — SODIUM CHLORIDE 0.9 % IJ SYRG
Freq: Three times a day (TID) | INTRAMUSCULAR | Status: DC
Start: 2019-02-02 — End: 2019-02-03

## 2019-02-02 MED ORDER — ONDANSETRON 8 MG TAB, RAPID DISSOLVE
8 mg | Freq: Four times a day (QID) | ORAL | Status: DC | PRN
Start: 2019-02-02 — End: 2019-02-03
  Administered 2019-02-02: 15:00:00 via ORAL

## 2019-02-02 MED ORDER — BUTORPHANOL TARTRATE 2 MG/ML IJ SOLN
2 mg/mL | INTRAMUSCULAR | Status: DC | PRN
Start: 2019-02-02 — End: 2019-02-03
  Administered 2019-02-03 (×2): via INTRAVENOUS

## 2019-02-02 MED ORDER — LACTATED RINGERS BOLUS IV
INTRAVENOUS | Status: DC | PRN
Start: 2019-02-02 — End: 2019-02-03

## 2019-02-02 MED ORDER — SODIUM CHLORIDE 0.9 % IJ SYRG
INTRAMUSCULAR | Status: DC | PRN
Start: 2019-02-02 — End: 2019-02-03

## 2019-02-02 MED ORDER — MINERAL OIL ORAL
Freq: Once | ORAL | Status: AC | PRN
Start: 2019-02-02 — End: 2019-02-03

## 2019-02-02 MED ORDER — LIDOCAINE HCL 1 % (10 MG/ML) IJ SOLN
10 mg/mL (1 %) | Freq: Once | INTRAMUSCULAR | Status: AC | PRN
Start: 2019-02-02 — End: 2019-02-03

## 2019-02-02 MED ORDER — OXYTOCIN 0.06 UNIT/ML BOLUS FROM BAG (POST-PARTUM)
30 unit/500 mL | INTRAVENOUS | Status: DC | PRN
Start: 2019-02-02 — End: 2019-02-03

## 2019-02-02 MED ORDER — LIDOCAINE 2 % MUCOSAL GEL
2 % | Freq: Once | Status: AC | PRN
Start: 2019-02-02 — End: 2019-02-03

## 2019-02-02 MED FILL — VITAMIN C 500 MG TABLET: 500 mg | ORAL | Qty: 1

## 2019-02-02 MED FILL — POTASSIUM CHLORIDE SR 20 MEQ TAB, PARTICLES/CRYSTALS: 20 mEq | ORAL | Qty: 2

## 2019-02-02 MED FILL — MISOPROSTOL 50 MCG (0.5 X 100 MCG) TAB: 50 mcg | ORAL | Qty: 1

## 2019-02-02 MED FILL — ONDANSETRON 4 MG TAB, RAPID DISSOLVE: 4 mg | ORAL | Qty: 2

## 2019-02-02 MED FILL — FERROUS SULFATE 325 MG (65 MG ELEMENTAL IRON) TAB: 325 mg (65 mg iron) | ORAL | Qty: 1

## 2019-02-02 MED FILL — DOCUSATE SODIUM 100 MG CAP: 100 mg | ORAL | Qty: 1

## 2019-02-02 MED FILL — DEXTROSE 5%-LACTATED RINGERS IV: INTRAVENOUS | Qty: 1000

## 2019-02-02 NOTE — Progress Notes (Signed)
Late entry due to hands on patient care.     1922 RN to bedside. Shift assessment complete.   TOCO and US adjusted. BP elevated, cuff adjusted and set to cycle Q 15 min. Pt was breathing through a contraction @ time of BP. Rates ctx pain 4-5/10 using a numeric pain scale.      Pt denies needs at this time. Encouraged to call out PRN. Voiced understanding.

## 2019-02-02 NOTE — Progress Notes (Signed)
Care relinquished To Melanie Clement RN

## 2019-02-02 NOTE — Progress Notes (Signed)
Patient resting in bed, eyes closed, no signs of distress noted.

## 2019-02-02 NOTE — Progress Notes (Signed)
02/02/19 0956   Cervical Exam   Dilation (cm) 0   Eff 0 %   Station -2   Position Mid   SVE done after Dr Heidtman called.  External os is a ft.  Pt tolerated exam well.

## 2019-02-02 NOTE — Progress Notes (Signed)
Attempt to start IV,  Called Emily CRNA to come and help.

## 2019-02-02 NOTE — Progress Notes (Signed)
Pt encouraged to have lunch.  Will start induction after that!

## 2019-02-02 NOTE — Progress Notes (Signed)
For induction today d/t mild PE.  No PreE sx.  Cervix not favorable will start with cytotec.  Process discussed, all questions answered.

## 2019-02-02 NOTE — Progress Notes (Signed)
SBAR report fro C. Burnett, RN. Care of pt assumed.

## 2019-02-02 NOTE — Progress Notes (Addendum)
SBAR given to kerri Southerlan RN. Care relinquished

## 2019-02-02 NOTE — Progress Notes (Signed)
SBAR received from Melanie Clement, RN. Care assumed.

## 2019-02-02 NOTE — Progress Notes (Signed)
SBAR given to M. Hall, RN. Care relainquished

## 2019-02-02 NOTE — Progress Notes (Signed)
Report received from Casey Burnett RN, care assumed.

## 2019-02-02 NOTE — Progress Notes (Signed)
Shift assessment complete. See flowsheet for details. POC reviewed with pt and pt verbalized understanding. Pt oriented to room and has call light within reach. Pt denies any needs at this time but will call out PRN. Pt now resting in bed with husband at bedside.

## 2019-02-02 NOTE — Progress Notes (Signed)
SBAR given to M. Margo Aye, Char[employer] fund[employer]. Care relainquished

## 2019-02-02 NOTE — Progress Notes (Signed)
Late entry due to hands on patient care.     1922 RN to bedside. Shift assessment complete.   TOCO and [place] adjusted. BP elevated, cuff adjusted and set to cycle Q 15 min. Pt was breathing through a contraction @ time of BP. Rates ctx pain 4-5/10 using a numeric pain scale.      Pt denies needs at this time. Encouraged to call out PRN. Voiced understanding.

## 2019-02-02 NOTE — Progress Notes (Signed)
Shift assessment complete. See flowsheet for details. POC reviewed with pt and pt verbalized understanding. Pt oriented to room and has call light within reach. Pt denies any needs at this time but will call out PRN. Pt now resting in bed with husband at bedside.

## 2019-02-02 NOTE — Progress Notes (Signed)
Pt encouraged to have lunch.  Will start induction after that!

## 2019-02-02 NOTE — Progress Notes (Signed)
SBAR given to [employer]. Care relinquished

## 2019-02-02 NOTE — Progress Notes (Signed)
Care relinquished To Jose Persia RN

## 2019-02-02 NOTE — Progress Notes (Signed)
For induction today d/t mild PE.  No PreE sx.  Cervix not favorable will start with cytotec.  Process discussed, all questions answered.

## 2019-02-02 NOTE — Progress Notes (Signed)
SBAR report fro Rolm Baptise, RN. Care of pt assumed.

## 2019-02-02 NOTE — Progress Notes (Signed)
SBAR received from Jose Persia, Charity fundraiser. Care assumed.

## 2019-02-02 NOTE — Progress Notes (Signed)
02/02/19 0956   Cervical Exam   Dilation (cm) 0   Eff 0 %   Station -2   Position Mid   SVE done after Dr Sheran Spine called.  External os is a ft.  Pt tolerated exam well.

## 2019-02-02 NOTE — Progress Notes (Signed)
Patient resting in bed, eyes closed, no signs of distress noted.

## 2019-02-02 NOTE — Progress Notes (Signed)
Attempt to start IV,  Teodoro Kil CRNA to come and help.

## 2019-02-03 ENCOUNTER — Inpatient Hospital Stay: Admit: 2019-02-03 | Payer: BLUE CROSS/BLUE SHIELD

## 2019-02-03 LAB — BLOOD GAS, CORD BLOOD
BASE DEFICIT (POC): 13 mmol/L
BASE DEFICIT (POC): 4 mmol/L
Base deficit (POC): 13 mmol/L
Base deficit (POC): 4 mmol/L
CO2, POC: 24 MMOL/L
CO2, POC: 27 MMOL/L
COLLECT TIME: 1403
COLLECT TIME: 1403
COLLECT TIME: 1403
COLLECT TIME: 1403
HCO3 (POC): 23 MMOL/L (ref 22–26)
HCO3 ISTAT, VENOUS,HCO3IV: 23 MMOL/L (ref 23–28)
HCO3, Art: 23 MMOL/L (ref 22–26)
HCO3, venous (POC): 23 MMOL/L (ref 23–28)
PCO2 Cord Blood: 130 mmHg — ABNORMAL HIGH (ref 32–68)
PCO2 Cord Blood: 47 mmHg (ref 32–68)
PH CORD BLOOD ISTAT,PHICB: 6.86 — ABNORMAL LOW (ref 7.15–7.38)
PH CORD BLOOD ISTAT,PHICB: 7.3 (ref 7.15–7.38)
PO2 Cord Blood: 46 mmHg
PO2 Cord Blood: <5 mmHg
SO2, VENOUS (POC): 77 % (ref 65–88)
TCO2, POC: 24 MMOL/L
TCO2, POC: 27 MMOL/L
pCO2 cord blood: 130 mmHg — ABNORMAL HIGH (ref 32–68)
pCO2 cord blood: 47 mmHg (ref 32–68)
pH, cord blood (POC): 6.86 — ABNORMAL LOW (ref 7.15–7.38)
pH, cord blood (POC): 7.3 (ref 7.15–7.38)
pO2 cord blood: 46 mmHg
pO2 cord blood: <5 mmHg
sO2, venous (POC): 77 % (ref 65–88)

## 2019-02-03 MED ORDER — KETOROLAC TROMETHAMINE 30 MG/ML INJECTION
30 mg/mL (1 mL) | INTRAMUSCULAR | Status: DC | PRN
Start: 2019-02-03 — End: 2019-02-03
  Administered 2019-02-03: 19:00:00 via INTRAVENOUS

## 2019-02-03 MED ORDER — HYDROMORPHONE (PF) 1 MG/ML IJ SOLN
1 mg/mL | INTRAMUSCULAR | Status: DC | PRN
Start: 2019-02-03 — End: 2019-02-03
  Administered 2019-02-03: 22:00:00 via INTRAVENOUS

## 2019-02-03 MED ORDER — DIPHTH,PERTUS(AC)TETANUS VAC(PF) 2.5 LF UNIT-8 MCG-5 LF/0.5 ML INJ
INTRAMUSCULAR | Status: DC
Start: 2019-02-03 — End: 2019-02-04

## 2019-02-03 MED ORDER — SODIUM CHLORIDE 0.9 % IJ SYRG
Freq: Three times a day (TID) | INTRAMUSCULAR | Status: DC
Start: 2019-02-03 — End: 2019-02-04
  Administered 2019-02-04 (×3): via INTRAVENOUS

## 2019-02-03 MED ORDER — DOCUSATE SODIUM 100 MG CAP
100 mg | Freq: Two times a day (BID) | ORAL | Status: DC
Start: 2019-02-03 — End: 2019-02-04
  Administered 2019-02-03 – 2019-02-04 (×2): via ORAL

## 2019-02-03 MED ORDER — SODIUM CITRATE-CITRIC ACID 500 MG-334 MG/5 ML ORAL SOLN
500-334 mg/5 mL | ORAL | Status: DC
Start: 2019-02-03 — End: 2019-02-03

## 2019-02-03 MED ORDER — ROCURONIUM 10 MG/ML IV
10 mg/mL | INTRAVENOUS | Status: DC | PRN
Start: 2019-02-03 — End: 2019-02-03
  Administered 2019-02-03: 19:00:00 via INTRAVENOUS

## 2019-02-03 MED ORDER — SIMETHICONE 80 MG CHEWABLE TAB
80 mg | Freq: Four times a day (QID) | ORAL | Status: DC | PRN
Start: 2019-02-03 — End: 2019-02-04
  Administered 2019-02-04: 04:00:00 via ORAL

## 2019-02-03 MED ORDER — SODIUM CHLORIDE 0.9 % IJ SYRG
INTRAMUSCULAR | Status: DC | PRN
Start: 2019-02-03 — End: 2019-02-04

## 2019-02-03 MED ORDER — ONDANSETRON (PF) 4 MG/2 ML INJECTION
4 mg/2 mL | INTRAMUSCULAR | Status: DC | PRN
Start: 2019-02-03 — End: 2019-02-04

## 2019-02-03 MED ORDER — PROPOFOL 10 MG/ML IV EMUL
10 mg/mL | INTRAVENOUS | Status: DC | PRN
Start: 2019-02-03 — End: 2019-02-03
  Administered 2019-02-03 (×2): via INTRAVENOUS

## 2019-02-03 MED ORDER — DIPHENHYDRAMINE 25 MG CAP
25 mg | Freq: Four times a day (QID) | ORAL | Status: DC | PRN
Start: 2019-02-03 — End: 2019-02-04

## 2019-02-03 MED ORDER — MORPHINE 2 MG/ML INJECTION
2 mg/mL | Freq: Once | INTRAMUSCULAR | Status: AC
Start: 2019-02-03 — End: 2019-02-03
  Administered 2019-02-03: 15:00:00 via INTRAVENOUS

## 2019-02-03 MED ORDER — ONDANSETRON (PF) 4 MG/2 ML INJECTION
4 mg/2 mL | INTRAMUSCULAR | Status: DC | PRN
Start: 2019-02-03 — End: 2019-02-03
  Administered 2019-02-03: 19:00:00 via INTRAVENOUS

## 2019-02-03 MED ORDER — FAMOTIDINE 20 MG TAB
20 mg | Freq: Every day | ORAL | Status: DC
Start: 2019-02-03 — End: 2019-02-04
  Administered 2019-02-04: 14:00:00 via ORAL

## 2019-02-03 MED ORDER — OXYCODONE 5 MG TAB
5 mg | Freq: Once | ORAL | Status: DC | PRN
Start: 2019-02-03 — End: 2019-02-03

## 2019-02-03 MED ORDER — CEFAZOLIN 2 GRAM/20 ML IN STERILE WATER INTRAVENOUS SYRINGE
2 gram/0 mL | INTRAVENOUS | Status: DC | PRN
Start: 2019-02-03 — End: 2019-02-03
  Administered 2019-02-03: 19:00:00 via INTRAVENOUS

## 2019-02-03 MED ORDER — OXYTOCIN 30 UNIT IN 500 ML INFUSION
30 unit/500 mL | INTRAVENOUS | Status: DC
Start: 2019-02-03 — End: 2019-02-03
  Administered 2019-02-03 (×10): via INTRAVENOUS

## 2019-02-03 MED ORDER — FENTANYL CITRATE (PF) 50 MCG/ML IJ SOLN
50 mcg/mL | INTRAMUSCULAR | Status: AC
Start: 2019-02-03 — End: ?

## 2019-02-03 MED ORDER — FENTANYL CITRATE (PF) 50 MCG/ML IJ SOLN
50 mcg/mL | INTRAMUSCULAR | Status: DC | PRN
Start: 2019-02-03 — End: 2019-02-03
  Administered 2019-02-03 (×5): via INTRAVENOUS

## 2019-02-03 MED ORDER — ROCURONIUM 10 MG/ML IV
10 mg/mL | INTRAVENOUS | Status: AC
Start: 2019-02-03 — End: ?

## 2019-02-03 MED ORDER — OXYCODONE 5 MG TAB
5 mg | ORAL | Status: DC | PRN
Start: 2019-02-03 — End: 2019-02-04
  Administered 2019-02-03 – 2019-02-04 (×3): via ORAL

## 2019-02-03 MED ORDER — OXYTOCIN 30 UNIT IN 500 ML INFUSION
30 unit/500 mL | INTRAVENOUS | Status: DC | PRN
Start: 2019-02-03 — End: 2019-02-03
  Administered 2019-02-03: 19:00:00 via INTRAVENOUS

## 2019-02-03 MED ORDER — IBUPROFEN 800 MG TAB
800 mg | Freq: Four times a day (QID) | ORAL | Status: DC | PRN
Start: 2019-02-03 — End: 2019-02-04
  Administered 2019-02-03 – 2019-02-04 (×2): via ORAL

## 2019-02-03 MED ORDER — SODIUM CHLORIDE 0.9 % INJECTION
25 mg/mL | INTRAMUSCULAR | Status: DC | PRN
Start: 2019-02-03 — End: 2019-02-03

## 2019-02-03 MED ORDER — SUCCINYLCHOLINE CHLORIDE 20 MG/ML INJECTION
20 mg/mL | INTRAMUSCULAR | Status: DC | PRN
Start: 2019-02-03 — End: 2019-02-03
  Administered 2019-02-03: 19:00:00 via INTRAVENOUS

## 2019-02-03 MED ORDER — ZOLPIDEM 5 MG TAB
5 mg | Freq: Every evening | ORAL | Status: DC | PRN
Start: 2019-02-03 — End: 2019-02-04

## 2019-02-03 MED ORDER — AZITHROMYCIN 500 MG IN NS 250 ML
500 mg/250 mL | INTRAVENOUS | Status: DC | PRN
Start: 2019-02-03 — End: 2019-02-03
  Administered 2019-02-03: 19:00:00 via INTRAVENOUS

## 2019-02-03 MED ORDER — LACTATED RINGERS IV
INTRAVENOUS | Status: DC
Start: 2019-02-03 — End: 2019-02-03

## 2019-02-03 MED ORDER — PROPOFOL 10 MG/ML IV EMUL
10 mg/mL | INTRAVENOUS | Status: AC
Start: 2019-02-03 — End: ?

## 2019-02-03 MED ORDER — SUCCINYLCHOLINE CHLORIDE 20 MG/ML INJECTION
20 mg/mL | INTRAMUSCULAR | Status: AC
Start: 2019-02-03 — End: ?

## 2019-02-03 MED ORDER — DEXTROSE 5%-LACTATED RINGERS IV
INTRAVENOUS | Status: DC
Start: 2019-02-03 — End: 2019-02-04
  Administered 2019-02-04: 01:00:00 via INTRAVENOUS

## 2019-02-03 MED ORDER — ONDANSETRON (PF) 4 MG/2 ML INJECTION
4 mg/2 mL | INTRAMUSCULAR | Status: AC
Start: 2019-02-03 — End: ?

## 2019-02-03 MED ORDER — NALOXONE 0.4 MG/ML INJECTION
0.4 mg/mL | INTRAMUSCULAR | Status: DC | PRN
Start: 2019-02-03 — End: 2019-02-04

## 2019-02-03 MED FILL — HYDROMORPHONE (PF) 1 MG/ML IJ SOLN: 1 mg/mL | INTRAMUSCULAR | Qty: 1

## 2019-02-03 MED FILL — OXYTOCIN 30 UNIT IN 500 ML INFUSION: 30 unit/500 mL | INTRAVENOUS | Qty: 500

## 2019-02-03 MED FILL — FENTANYL CITRATE (PF) 50 MCG/ML IJ SOLN: 50 mcg/mL | INTRAMUSCULAR | Qty: 2

## 2019-02-03 MED FILL — MISOPROSTOL 50 MCG (0.5 X 100 MCG) TAB: 50 mcg | ORAL | Qty: 1

## 2019-02-03 MED FILL — MORPHINE 2 MG/ML INJECTION: 2 mg/mL | INTRAMUSCULAR | Qty: 3

## 2019-02-03 MED FILL — QUELICIN 20 MG/ML INJECTION SOLUTION: 20 mg/mL | INTRAMUSCULAR | Qty: 10

## 2019-02-03 MED FILL — BUTORPHANOL TARTRATE 2 MG/ML IJ SOLN: 2 mg/mL | INTRAMUSCULAR | Qty: 1

## 2019-02-03 MED FILL — ROCURONIUM 10 MG/ML IV: 10 mg/mL | INTRAVENOUS | Qty: 5

## 2019-02-03 MED FILL — POTASSIUM CHLORIDE SR 20 MEQ TAB, PARTICLES/CRYSTALS: 20 mEq | ORAL | Qty: 2

## 2019-02-03 MED FILL — DOCUSATE SODIUM 100 MG CAP: 100 mg | ORAL | Qty: 1

## 2019-02-03 MED FILL — ONDANSETRON (PF) 4 MG/2 ML INJECTION: 4 mg/2 mL | INTRAMUSCULAR | Qty: 2

## 2019-02-03 MED FILL — PROPOFOL 10 MG/ML IV EMUL: 10 mg/mL | INTRAVENOUS | Qty: 20

## 2019-02-03 MED FILL — IBUPROFEN 800 MG TAB: 800 mg | ORAL | Qty: 1

## 2019-02-03 MED FILL — OXYCODONE 5 MG TAB: 5 mg | ORAL | Qty: 2

## 2019-02-03 NOTE — Op Note (Signed)
Cesarean Section Delivery Operative Report       Patient: Debra Warren: [id number]  SSN: [id number]    Date of Birth: 04/29/1990  Age: 28 y.o.  Sex: female       Date of Procedure: 02/03/2019     Preoperative Diagnosis: edd 02/23/2019 ; induction for mild preeclampsia; cord prolapse with rom    Postoperative Diagnosis: same with delivery of viable girl infant APGARS 3,7,8  Wt 3750g    Procedure: emergent primary low transverse cesarean section    Anesthesia: Spinal    Prophylactic Antibiotics: none given prior to surgery; received ancef and zithromax after incision and baby out    Estimated Blood Loss:      Information for the patient's newborn:  Debra, Warren [[id number]]   Delivery of a   female infant on 02/03/2019 at 2:03 PM. Apgars were 3  and 7 .    Umbilical Cord: 3 Vessels     Umbilical Cord Events: Prolapsed     Placenta: Expressed removal with Normal;Intact appearance.    Amniotic Fluid Volume:  Large     Amniotic Fluid Description:            Specimens:   ID Type Source Tests Collected by Time Destination   1 :  Placenta   Adela Ports, MD 02/03/2019 1437 Discarded               Complications:  None    Reason for procedure: was checking her cervix and she was 4 cm dilated. There was a bulging bag. When I tried to feel for the presenting part (vertex confirmed by bedside ultrasound earlier), her membranes ruptured. Immediately a cord prolapsed and a small extremity was felt. Emergent cesarean section was called. The nurse and I changed position and she tried to protect the cord. We went to the OR immediately to prepare for stat surgery.     Procedure Details:     The patient was taken to the operating room. She was placed on the table. The nurse was still elevating the presenting part to protect the cord. Anesthesia took control for general. The abdomen was doused with betadine. She was draped. As soon as anesthesia gave the ok I made the incision. The abdomen was entered using the Pfannenstiel technique. Fascia was incised in the midline and then bluntly extended. The peritoneum was entered sharply well superior to the bladder and extended with blunt dissection. The bladder blade was then inserted. The vesicouterine peritoneum was identified and entered sharply with Metzenbaum scissors. The bladder flap was then created sharply and the bladder blade was reinserted. A low transverse uterine incision was made with the scalpel and extended with blunt dissection. Amniotomy was performed and there was clear fluid. The initial presenting part was a hand but then the head was located and the baby was delivered quickly. The cord was clamped and cut and the baby was handed off to the waiting neonatal care unit staff. Placenta was then expressed from the uterus. The uterus was exteriorized and cleared of all clots and debris. The uterine incision was closed with number 0 vicryl in running locking fashion and a second imbricating stitch . The right uterine vessels were injured with blunt dissection. This was controlled first with Antony Salmon suture. Next the uterus was closed with two layer. Small oozing was oversewn with figure of 8s.  The ovaries and tubes were examined and found to be normal.  The posterior cul-de-sac was irrigated  with warm normal saline. The uterus was returned to the abdomen. The anterior pelvis was irrigated with warm normal saline. As a precaution surgicel powder was used.   Rectus muscles were reapproximated with interrupted figure of 8s with 0 vicryl. The fascia was closed with 0 vicryl in a running fashion. Good hemostasis was assured.  The skin was closed with  a barbed 3.0 monocryl in a subcuticular fashion.  The patient tolerated the procedure well. Sponge, lap, and needle counts were correct times three and the patient and baby were taken to recovery/postpartum room in stable condition.    Signed By: Adela Ports, MD     February 03, 2019

## 2019-02-03 NOTE — Progress Notes (Signed)
Stat xray ordered for the OR. Talked to Paige in radiology.

## 2019-02-03 NOTE — Progress Notes (Signed)
Dr. Keith at bedside examining patient. SVE preformed by MD. Uultrasound of abdomen preformed by MD to verify infant position.  Pt repositioned with a left hip tilt.

## 2019-02-03 NOTE — Progress Notes (Signed)
Chest xray ordered per Dr. Ulmer. RN called xray. Xray states they will be right up

## 2019-02-03 NOTE — Progress Notes (Addendum)
Dr. Leis at bedside doing US to verify infant position. Infant verified as vertex per MD.

## 2019-02-03 NOTE — Other (Signed)
SBAR IN Report: Mother    Verbal report received from Sarah Huggins RN on this patient, who is now being transferred from L&D (unit) for routine progression of care.  The patient is not wearing a green "Anesthesia-Duramorph" band.    Report consisted of patient's Situation, Background, Assessment and Recommendations (SBAR).       Newborn ID bands were compared with the identification form, and verified with the patient and transferring nurse.    Information from the SBAR and the [facility] Report was reviewed with the transferring nurse; opportunity for questions and clarification provided.

## 2019-02-03 NOTE — Progress Notes (Signed)
Mom started pumped. Instructed to pump every 3-4 hours. Clean pump parts with soap and water. Patient verbalized understanding.

## 2019-02-03 NOTE — Progress Notes (Signed)
Admission assessment complete.

## 2019-02-03 NOTE — Progress Notes (Signed)
Neonatal transport team at bedside for mom to see infant and obtaining consents for the transfer

## 2019-02-03 NOTE — Progress Notes (Signed)
Bedside report received from Meagan Eby, RN. Patient care assumed.

## 2019-02-03 NOTE — Progress Notes (Signed)
SBAR OUT Report: Mother    Verbal report given to M. Eby RN (full name & credentials) on this patient, who is now being transferred to MIU (unit) for routine progression of care.   The patient is wearing a green "Anesthesia-Duramorph" band.    Report consisted of patient's Situation, Background, Assessment and Recommendations (SBAR).       Information from the SBAR and the [facility] Report was reviewed with the receiving nurse; opportunity for questions and clarification provided.

## 2019-02-03 NOTE — Progress Notes (Signed)
02/03/19 2158   Pain 1   Pain Scale 1 Numeric (0 - 10)   Pain Intensity 1 7   Patient Stated Pain Goal 4   Pain Location 1 Abdomen   Pain Orientation 1 Lower   Pain Description 1 Sore   Pain Intervention(s) 1 Medication (see MAR)   Patient Observation   Observations Dilaudid given for pain   Activity In bed

## 2019-02-03 NOTE — Progress Notes (Signed)
Dr. Heidtman updated that pt has been contracting every 1-4 minutes throughout the night and has been unable to receive another dose of cytotec due to contraction frequency per policy. MD updated on SVE 1/50/-3, pt uncomfortable with contractions and pt received Stadol at 0214. Start pitocin at 0500 per MD.

## 2019-02-03 NOTE — Progress Notes (Signed)
MD at bedside for SVE. Membranes ruptured, large amount of clear fluid noted. MD states that there is a cord prolapse. MD remains at bedside applying pressure on presenting part pushing presenting part off cord. RN calls for help and activates emergency C/S. Team members arrive to assist. S. Huggins RN dones sterile glove and switches positions with MD; applying pressure to presenting part. RN remained on bed while being wheeled to OR and transferred with pt to OR stretcher. RN remained with pressure applied to presenting part until delivery.

## 2019-02-03 NOTE — Anesthesia Pre-Procedure Evaluation (Signed)
Relevant Problems   No relevant active problems       Anesthetic History               Review of Systems / Medical History  Patient summary reviewed and pertinent labs reviewed    Pulmonary          Smoker  Asthma        Neuro/Psych              Cardiovascular    Hypertension              Exercise tolerance: >4 METS     GI/Hepatic/Renal     GERD           Endo/Other             Other Findings   Comments: Prolapsed umbilical cord           Physical Exam    Airway  Mallampati: II  TM Distance: > 6 cm  Neck ROM: normal range of motion   Mouth opening: Normal     Cardiovascular  Regular rate and rhythm,  S1 and S2 normal,  no murmur, click, rub, or gallop  Rhythm: regular  Rate: normal         Dental         Pulmonary  Breath sounds clear to auscultation               Abdominal         Other Findings            Anesthetic Plan    ASA: 2, emergent  Anesthesia type: general            Anesthetic plan and risks discussed with: Patient

## 2019-02-03 NOTE — Progress Notes (Signed)
SBAR report out to S. Huggins, RN. Care of pt relinquished.

## 2019-02-03 NOTE — Progress Notes (Signed)
SBAR report received from M. Hall RN. Care assumed

## 2019-02-03 NOTE — Progress Notes (Signed)
Problem: Cesarean Delivery: Day of Delivery  Goal: Activity/Safety  Outcome: Progressing Towards Goal  Goal: Nutrition/Diet  Outcome: Progressing Towards Goal  Goal: Medications  Outcome: Progressing Towards Goal  Goal: Respiratory  Outcome: Progressing Towards Goal  Goal: *Vital signs within defined limits  Outcome: Progressing Towards Goal  Goal: *Hemodynamically stable  Outcome: Progressing Towards Goal  Goal: *Optimal pain control at patient's stated goal  Outcome: Progressing Towards Goal  Goal: *Demonstrates progressive activity  Outcome: Progressing Towards Goal  Goal: *Tolerating diet  Outcome: Progressing Towards Goal

## 2019-02-03 NOTE — Progress Notes (Signed)
02/03/19 2158   Pain 1   Pain Scale 1 Numeric (0 - 10)   Pain Intensity 1 7   Patient Stated Pain Goal 4   Pain Location 1 Abdomen   Pain Orientation 1 Lower   Pain Description 1 Sore   Pain Intervention(s) 1 Medication (see MAR)   Patient Observation   Observations Dilaudid given for pain   Activity In bed

## 2019-02-03 NOTE — Anesthesia Pre-Procedure Evaluation (Signed)
Relevant Problems   No relevant active problems       Anesthetic History               Review of Systems / Medical History  Patient summary reviewed and pertinent labs reviewed    Pulmonary          Smoker  Asthma        Neuro/Psych              Cardiovascular    Hypertension              Exercise tolerance: >4 METS     GI/Hepatic/Renal     GERD           Endo/Other             Other Findings   Comments: Prolapsed umbilical cord           Physical Exam    Airway  Mallampati: II  TM Distance: > 6 cm  Neck ROM: normal range of motion   Mouth opening: Normal     Cardiovascular  Regular rate and rhythm,  S1 and S2 normal,  no murmur, click, rub, or gallop  Rhythm: regular  Rate: normal         Dental         Pulmonary  Breath sounds clear to auscultation               Abdominal         Other Findings            Anesthetic Plan    ASA: 2, emergent  Anesthesia type: general            Anesthetic plan and risks discussed with: Patient

## 2019-02-03 NOTE — Progress Notes (Signed)
Mom started pumped. Instructed to pump every 3-4 hours. Clean pump parts with soap and water. Patient verbalized understanding.

## 2019-02-03 NOTE — Progress Notes (Signed)
Dr. Fawn Kirk at bedside doing [place] to verify infant position. Infant verified as vertex per MD.

## 2019-02-03 NOTE — Op Note (Signed)
Cesarean Section Delivery Operative Report       Patient: Debra Warren MRN: [id number]  SSN: [id number]    Date of Birth: 1990-06-17  Age: 28 y.o.  Sex: female       Date of Procedure: 02/03/2019     Preoperative Diagnosis: edd 02/23/2019 ; induction for mild preeclampsia; cord prolapse with rom    Postoperative Diagnosis: same with delivery of viable girl infant APGARS 3,7,8  Wt 3750g    Procedure: emergent primary low transverse cesarean section    Anesthesia: Spinal    Prophylactic Antibiotics: none given prior to surgery; received ancef and zithromax after incision and baby out    Estimated Blood Loss:      Information for the patient's newborn:  Jinger, [place] [[id number]]   Delivery of a   female infant on 02/03/2019 at 2:03 PM. Apgars were 3  and 7 .    Umbilical Cord: 3 Vessels     Umbilical Cord Events: Prolapsed     Placenta: Expressed removal with Normal;Intact appearance.    Amniotic Fluid Volume:  Large     Amniotic Fluid Description:            Specimens:   ID Type Source Tests Collected by Time Destination   1 :  Placenta   Adela Ports, MD 02/03/2019 1437 Discarded               Complications:  None    Reason for procedure: was checking her cervix and she was 4 cm dilated. There was a bulging bag. When I tried to feel for the presenting part (vertex confirmed by bedside ultrasound earlier), her membranes ruptured. Immediately a cord prolapsed and a small extremity was felt. Emergent cesarean section was called. The nurse and I changed position and she tried to protect the cord. We went to the OR immediately to prepare for stat surgery.     Procedure Details:    The patient was taken to the operating room. She was placed on the table. The nurse was still elevating the presenting part to protect the cord. Anesthesia took control for general. The abdomen was doused with betadine. She was draped. As soon as anesthesia gave the ok I made the incision. The abdomen was entered using the Pfannenstiel  technique. Fascia was incised in the midline and then bluntly extended. The peritoneum was entered sharply well superior to the bladder and extended with blunt dissection. The bladder blade was then inserted. The vesicouterine peritoneum was identified and entered sharply with Metzenbaum scissors. The bladder flap was then created sharply and the bladder blade was reinserted. A low transverse uterine incision was made with the scalpel and extended with blunt dissection. Amniotomy was performed and there was clear fluid. The initial presenting part was a hand but then the head was located and the baby was delivered quickly. The cord was clamped and cut and the baby was handed off to the waiting neonatal care unit staff. Placenta was then expressed from the uterus. The uterus was exteriorized and cleared of all clots and debris. The uterine incision was closed with number 0 vicryl in running locking fashion and a second imbricating stitch . The right uterine vessels were injured with blunt dissection. This was controlled first with Antony Salmon suture. Next the uterus was closed with two layer. Small oozing was oversewn with figure of 8s.  The ovaries and tubes were examined and found to be normal.  The posterior cul-de-sac was irrigated  with warm normal saline. The uterus was returned to the abdomen. The anterior pelvis was irrigated with warm normal saline. As a precaution surgicel powder was used.   Rectus muscles were reapproximated with interrupted figure of 8s with 0 vicryl. The fascia was closed with 0 vicryl in a running fashion. Good hemostasis was assured.  The skin was closed with a barbed 3.0 monocryl in a subcuticular fashion.  The patient tolerated the procedure well. Sponge, lap, and needle counts were correct times three and the patient and baby were taken to recovery/postpartum room in stable condition.    Signed By: Army Melia, MD     February 03, 2019

## 2019-02-03 NOTE — Progress Notes (Signed)
Dr. Sheran Spine updated that pt has been contracting every 1-4 minutes throughout the night and has been unable to receive another dose of cytotec due to contraction frequency per policy. MD updated on SVE 1/50/-3, pt uncomfortable with contractions and pt received Stadol at 0214. Start pitocin at 0500 per MD.

## 2019-02-03 NOTE — Progress Notes (Signed)
Stat xray ordered for the OR. Talked to [facility] in radiology.

## 2019-02-03 NOTE — Progress Notes (Signed)
Neonatal transport team at bedside for mom to see infant and obtaining consents for the transfer

## 2019-02-03 NOTE — Progress Notes (Signed)
Problem: Cesarean Delivery: Day of Delivery  Goal: Activity/Safety  Outcome: Progressing Towards Goal  Goal: Nutrition/Diet  Outcome: Progressing Towards Goal  Goal: Medications  Outcome: Progressing Towards Goal  Goal: Respiratory  Outcome: Progressing Towards Goal  Goal: *Vital signs within defined limits  Outcome: Progressing Towards Goal  Goal: *Hemodynamically stable  Outcome: Progressing Towards Goal  Goal: *Optimal pain control at patient's stated goal  Outcome: Progressing Towards Goal  Goal: *Demonstrates progressive activity  Outcome: Progressing Towards Goal  Goal: *Tolerating diet  Outcome: Progressing Towards Goal

## 2019-02-03 NOTE — Progress Notes (Signed)
MD at bedside for SVE. Membranes ruptured, large amount of clear fluid noted. MD states that there is a cord prolapse. MD remains at bedside applying pressure on presenting part pushing presenting part off cord. RN calls for help and activates emergency C/S. Team members arrive to assist. Maryla Morrow RN dones sterile glove and switches positions with MD; applying pressure to presenting part. RN remained on bed while being wheeled to OR and transferred with pt to OR stretcher. RN remained with pressure applied to presenting part until delivery.

## 2019-02-03 NOTE — Progress Notes (Signed)
Chest xray ordered per Dr. Marva Panda. RN called xray. Xray states they will be right up

## 2019-02-03 NOTE — Progress Notes (Signed)
SBAR report received from M. Chemical engineer. Care assumed

## 2019-02-03 NOTE — Progress Notes (Signed)
SBAR OUT Report: Mother    Verbal report given to M. Eby RN (full name & credentials) on this patient, who is now being transferred to MIU (unit) for routine progression of care.   The patient is wearing a green "Anesthesia-Duramorph" band.    Report consisted of patient's Situation, Background, Assessment and Recommendations (SBAR).       Information from the SBAR and the [facility] Report was reviewed with the receiving nurse; opportunity for questions and clarification provided.

## 2019-02-03 NOTE — Progress Notes (Signed)
Dr. Mellody Dance at bedside examining patient. SVE preformed by MD. [place] of abdomen preformed by MD to verify infant position.  Pt repositioned with a left hip tilt.

## 2019-02-04 LAB — CBC WITH AUTO DIFFERENTIAL
Basophils %: 0 % (ref 0.0–2.0)
Basophils Absolute: 0 10*3/uL (ref 0.0–0.2)
Eosinophils %: 1 % (ref 0.5–7.8)
Eosinophils Absolute: 0.1 10*3/uL (ref 0.0–0.8)
Granulocyte Absolute Count: 0.1 10*3/uL (ref 0.0–0.5)
Hematocrit: 26.1 % — ABNORMAL LOW (ref 35.8–46.3)
Hemoglobin: 8.2 g/dL — ABNORMAL LOW (ref 11.7–15.4)
Immature Granulocytes: 1 % (ref 0.0–5.0)
Lymphocytes %: 16 % (ref 13–44)
Lymphocytes Absolute: 1.5 10*3/uL (ref 0.5–4.6)
MCH: 25.7 PG — ABNORMAL LOW (ref 26.1–32.9)
MCHC: 31.4 g/dL (ref 31.4–35.0)
MCV: 81.8 FL (ref 79.6–97.8)
MPV: 10.3 FL (ref 9.4–12.3)
Monocytes %: 15 % — ABNORMAL HIGH (ref 4.0–12.0)
Monocytes Absolute: 1.5 10*3/uL — ABNORMAL HIGH (ref 0.1–1.3)
NRBC Absolute: 0 10*3/uL (ref 0.0–0.2)
Neutrophils %: 67 % (ref 43–78)
Neutrophils Absolute: 6.5 10*3/uL (ref 1.7–8.2)
Platelets: 179 10*3/uL (ref 150–450)
RBC: 3.19 M/uL — ABNORMAL LOW (ref 4.05–5.2)
RDW: 18.6 % — ABNORMAL HIGH (ref 11.9–14.6)
WBC: 9.7 10*3/uL (ref 4.3–11.1)

## 2019-02-04 LAB — COMPREHENSIVE METABOLIC PANEL
ALT: 32 U/L (ref 12–65)
AST: 44 U/L — ABNORMAL HIGH (ref 15–37)
Albumin/Globulin Ratio: 0.8 — ABNORMAL LOW (ref 1.2–3.5)
Albumin: 2.4 g/dL — ABNORMAL LOW (ref 3.5–5.0)
Alkaline Phosphatase: 185 U/L — ABNORMAL HIGH (ref 50–136)
Anion Gap: 8 mmol/L (ref 7–16)
BUN: 12 MG/DL (ref 6–23)
CO2: 23 mmol/L (ref 21–32)
Calcium: 8.8 MG/DL (ref 8.3–10.4)
Chloride: 102 mmol/L (ref 98–107)
Creatinine: 0.55 MG/DL — ABNORMAL LOW (ref 0.6–1.0)
EGFR IF NonAfrican American: >60 mL/min/{1.73_m2} (ref >60)
GFR African American: >60 mL/min/{1.73_m2} (ref >60)
Globulin: 3.1 g/dL (ref 2.3–3.5)
Glucose: 87 mg/dL (ref 65–100)
Potassium: 4.2 mmol/L (ref 3.5–5.1)
Sodium: 133 mmol/L — ABNORMAL LOW (ref 136–145)
Total Bilirubin: 3.1 MG/DL — ABNORMAL HIGH (ref 0.2–1.1)
Total Protein: 5.5 g/dL — ABNORMAL LOW (ref 6.3–8.2)

## 2019-02-04 LAB — HEMOGLOBIN AND HEMATOCRIT
Hematocrit: 27.8 % — ABNORMAL LOW (ref 35.8–46.3)
Hemoglobin: 8.7 g/dL — ABNORMAL LOW (ref 11.7–15.4)

## 2019-02-04 LAB — URIC ACID
Uric Acid, Serum: 5.7 MG/DL (ref 2.6–6.0)
Uric acid: 5.7 mg/dL (ref 2.6–6.0)

## 2019-02-04 LAB — METABOLIC PANEL, COMPREHENSIVE
A-G Ratio: 0.8 — ABNORMAL LOW (ref 1.2–3.5)
ALT (SGPT): 32 U/L (ref 12–65)
AST (SGOT): 44 U/L — ABNORMAL HIGH (ref 15–37)
Albumin: 2.4 g/dL — ABNORMAL LOW (ref 3.5–5.0)
Alk. phosphatase: 185 U/L — ABNORMAL HIGH (ref 50–136)
Anion gap: 8 mmol/L (ref 7–16)
BUN: 12 MG/DL (ref 6–23)
Bilirubin, total: 3.1 MG/DL — ABNORMAL HIGH (ref 0.2–1.1)
CO2: 23 mmol/L (ref 21–32)
Calcium: 8.8 MG/DL (ref 8.3–10.4)
Chloride: 102 mmol/L (ref 98–107)
Creatinine: 0.55 MG/DL — ABNORMAL LOW (ref 0.6–1.0)
GFR est AA: >60 mL/min/{1.73_m2} (ref >60)
GFR est non-AA: >60 mL/min/{1.73_m2} (ref >60)
Globulin: 3.1 g/dL (ref 2.3–3.5)
Glucose: 87 mg/dL (ref 65–100)
Potassium: 4.2 mmol/L (ref 3.5–5.1)
Protein, total: 5.5 g/dL — ABNORMAL LOW (ref 6.3–8.2)
Sodium: 133 mmol/L — ABNORMAL LOW (ref 136–145)

## 2019-02-04 LAB — CBC WITH AUTOMATED DIFF
ABS. BASOPHILS: 0 10*3/uL (ref 0.0–0.2)
ABS. EOSINOPHILS: 0.1 10*3/uL (ref 0.0–0.8)
ABS. IMM. GRANS.: 0.1 10*3/uL (ref 0.0–0.5)
ABS. LYMPHOCYTES: 1.5 10*3/uL (ref 0.5–4.6)
ABS. MONOCYTES: 1.5 10*3/uL — ABNORMAL HIGH (ref 0.1–1.3)
ABS. NEUTROPHILS: 6.5 10*3/uL (ref 1.7–8.2)
ABSOLUTE NRBC: 0 10*3/uL (ref 0.0–0.2)
BASOPHILS: 0 % (ref 0.0–2.0)
EOSINOPHILS: 1 % (ref 0.5–7.8)
HCT: 26.1 % — ABNORMAL LOW (ref 35.8–46.3)
HGB: 8.2 g/dL — ABNORMAL LOW (ref 11.7–15.4)
IMMATURE GRANULOCYTES: 1 % (ref 0.0–5.0)
LYMPHOCYTES: 16 % (ref 13–44)
MCH: 25.7 PG — ABNORMAL LOW (ref 26.1–32.9)
MCHC: 31.4 g/dL (ref 31.4–35.0)
MCV: 81.8 FL (ref 79.6–97.8)
MONOCYTES: 15 % — ABNORMAL HIGH (ref 4.0–12.0)
MPV: 10.3 FL (ref 9.4–12.3)
NEUTROPHILS: 67 % (ref 43–78)
PLATELET: 179 10*3/uL (ref 150–450)
RBC: 3.19 M/uL — ABNORMAL LOW (ref 4.05–5.2)
RDW: 18.6 % — ABNORMAL HIGH (ref 11.9–14.6)
WBC: 9.7 10*3/uL (ref 4.3–11.1)

## 2019-02-04 LAB — HGB & HCT
HCT: 27.8 % — ABNORMAL LOW (ref 35.8–46.3)
HGB: 8.7 g/dL — ABNORMAL LOW (ref 11.7–15.4)

## 2019-02-04 MED ORDER — KETOROLAC TROMETHAMINE 30 MG/ML INJECTION
30 mg/mL (1 mL) | Freq: Four times a day (QID) | INTRAMUSCULAR | Status: DC | PRN
Start: 2019-02-04 — End: 2019-02-04
  Administered 2019-02-04: 14:00:00 via INTRAVENOUS

## 2019-02-04 MED ORDER — IBUPROFEN 800 MG TAB
800 mg | Freq: Three times a day (TID) | ORAL | Status: DC | PRN
Start: 2019-02-04 — End: 2019-02-04
  Administered 2019-02-04: 19:00:00 via ORAL

## 2019-02-04 MED ORDER — ACETAMINOPHEN 500 MG TAB
500 mg | Freq: Four times a day (QID) | ORAL | Status: AC | PRN
Start: 2019-02-04 — End: 2019-02-04
  Administered 2019-02-04: 10:00:00 via ORAL

## 2019-02-04 MED ORDER — KETOROLAC TROMETHAMINE 30 MG/ML INJECTION
30 mg/mL (1 mL) | Freq: Four times a day (QID) | INTRAMUSCULAR | Status: DC | PRN
Start: 2019-02-04 — End: 2019-02-04

## 2019-02-04 MED ORDER — HYDROMORPHONE (PF) 1 MG/ML IJ SOLN
1 mg/mL | INTRAMUSCULAR | Status: DC | PRN
Start: 2019-02-04 — End: 2019-02-04
  Administered 2019-02-04: 03:00:00 via INTRAVENOUS

## 2019-02-04 MED ORDER — OXYCODONE 5 MG TAB
5 mg | ORAL | Status: DC | PRN
Start: 2019-02-04 — End: 2019-02-04
  Administered 2019-02-04 (×2): via ORAL

## 2019-02-04 MED ORDER — ACETAMINOPHEN 500 MG TAB
500 mg | Freq: Four times a day (QID) | ORAL | Status: DC | PRN
Start: 2019-02-04 — End: 2019-02-04
  Administered 2019-02-04: 19:00:00 via ORAL

## 2019-02-04 MED ORDER — LACTATED RINGERS BOLUS IV
Freq: Once | INTRAVENOUS | Status: AC
Start: 2019-02-04 — End: 2019-02-04
  Administered 2019-02-04: 08:00:00 via INTRAVENOUS

## 2019-02-04 MED FILL — POTASSIUM CHLORIDE SR 20 MEQ TAB, PARTICLES/CRYSTALS: 20 mEq | ORAL | Qty: 2

## 2019-02-04 MED FILL — HYDROMORPHONE (PF) 1 MG/ML IJ SOLN: 1 mg/mL | INTRAMUSCULAR | Qty: 1

## 2019-02-04 MED FILL — OXYCODONE 5 MG TAB: 5 mg | ORAL | Qty: 3

## 2019-02-04 MED FILL — OXYCODONE 5 MG TAB: 5 mg | ORAL | Qty: 2

## 2019-02-04 MED FILL — IBUPROFEN 800 MG TAB: 800 mg | ORAL | Qty: 1

## 2019-02-04 MED FILL — ACETAMINOPHEN 500 MG TAB: 500 mg | ORAL | Qty: 2

## 2019-02-04 MED FILL — ACETAMINOPHEN 500 MG TAB: 500 mg | ORAL | Qty: 1

## 2019-02-04 MED FILL — FAMOTIDINE 20 MG TAB: 20 mg | ORAL | Qty: 1

## 2019-02-04 MED FILL — DOCUSATE SODIUM 100 MG CAP: 100 mg | ORAL | Qty: 1

## 2019-02-04 MED FILL — KETOROLAC TROMETHAMINE 30 MG/ML INJECTION: 30 mg/mL (1 mL) | INTRAMUSCULAR | Qty: 1

## 2019-02-04 MED FILL — SIMETHICONE 80 MG CHEWABLE TAB: 80 mg | ORAL | Qty: 1

## 2019-02-04 NOTE — Progress Notes (Signed)
02/04/19 0442   Pain 1   Pain Scale 1 Numeric (0 - 10)   Pain Intensity 1 6   Patient Stated Pain Goal 4   Pain Location 1 Abdomen;Incisional   Pain Orientation 1 Lower   Pain Description 1 Sore   Pain Intervention(s) 1 Medication (see MAR)   Patient Observation   Observations tylenol and oxycodone given for pain   Activity In bed;Family/Visitors present

## 2019-02-04 NOTE — Progress Notes (Signed)
Report received from Priyanka Patel RN, care assumed.

## 2019-02-04 NOTE — Anesthesia Post-Procedure Evaluation (Signed)
Procedure(s):  CESAREAN SECTION.    general    Anesthesia Post Evaluation      Multimodal analgesia: multimodal analgesia not used between 6 hours prior to anesthesia start to PACU discharge  Patient location during evaluation: PACU  Patient participation: complete - patient participated  Level of consciousness: awake and alert  Pain management: adequate  Airway patency: patent  Anesthetic complications: no  Cardiovascular status: hemodynamically stable  Respiratory status: acceptable  Hydration status: acceptable        INITIAL Post-op Vital signs:   Vitals Value Taken Time   BP 135/86 02/04/19 0315   Temp 36.6 ??C (97.9 ??F) 02/04/19 0315   Pulse 105 02/04/19 0315   Resp 16 02/04/19 0315   SpO2 97 % 02/04/19 0315

## 2019-02-04 NOTE — Progress Notes (Signed)
Gave report to Casey English RN at [facility]. Pt to transfer to room 6332.

## 2019-02-04 NOTE — Progress Notes (Signed)
Pt left unit with [facility] via stretcher.

## 2019-02-04 NOTE — Progress Notes (Signed)
Pt up to restroom and voided 150 ml of amber urine. Encouraged fluid intake. Gave report to [facility] about pt transport to [facility].

## 2019-02-04 NOTE — Lactation Note (Signed)
In to see mom prior to her transferring to [facility]. She stated that she has just pumped and expressed a few drops. Encouraged her to continue continue to pump every 2-3 hours and to take her breast pump kit home with her. Informed her that she will be provided a breast pump to use while a patient in the hospital. Lactation consultants are available at hospital to assist her.

## 2019-02-04 NOTE — Progress Notes (Signed)
Chart reviewed - baby "Ophelia" transferred to [facility] yesterday.      SW met with patient while social distancing and wearing appropriate PPE.    Emotional support provided regarding baby's need to transfer to [facility].  Family states that they feel that they are coping well during this time.  Patient is hopeful to be transferred to [facility] later today to be with baby Ophelia.    Family denied any needs from social worker at this time.  SW will continue to follow.    Elizabeth Z. McKinney, LISW-CP  [facility]

## 2019-02-04 NOTE — Progress Notes (Signed)
Patient up to bathroom with RN assistance.  Peri-care taught and completed. Questions encouraged and answered.  Patient back to bed, encouraged to call for needs or concerns. Verbalizes understanding.         Foley still in per Dr. Keith order. Pt only had 50 ml urine output after bolus. SCDs on. Patient educated to call out for assistance to bathroom and prn. Patient verbalized understanding of education.

## 2019-02-04 NOTE — Progress Notes (Signed)
Patient has minimum output about 30ml/hr over 8 hours and concentrated color from orange to dark amber. Patient has not much to drink. Dr. Keith called and updated on patient output status. Order received to admin bolus of  IV LR 500ml over 60 minutes and leave foley in until MD makes round this am. Verbal readback provided.

## 2019-02-04 NOTE — Progress Notes (Signed)
Assessment complete. Pt's IV site wet.  Serous sanguinous fluid noted to be leaking out of IV site. Wet dressing removed and dry dressing applied. IV fluids restarted and site remains leaking fluid. IV repositioned, but remains leaking. Pt denies pain. IV removed. Encouraged PO fluids. Pt verbalized understanding. Foley catheter removed. Pt tolerated well.

## 2019-02-04 NOTE — Progress Notes (Signed)
Spoke to Dr. Kofeod. Pain medications adjusted and permission to pull foley catheter.

## 2019-02-04 NOTE — Progress Notes (Signed)
Assisted pt up to restroom. Pt unable to void. Encouraged pt to drink PO fluids. Pt ambulated to chair and will retry to void in 1 hour. RN to reassess.

## 2019-02-04 NOTE — Progress Notes (Signed)
Post-Operative Day Number 1 Progress Note    Patient doing well post-op day 1 from cesarean delivery without significant complaints.  Pain controlled on current medication.  Voiding without difficulty, normal lochia.    Patient Vitals for the past 24 hrs:   Temp Pulse Resp BP SpO2   02/04/19 0830 98.2 ??F (36.8 ??C) 97 16 117/71 100 %   02/04/19 0315 97.9 ??F (36.6 ??C) (!) 105 16 135/86 97 %   02/03/19 2246 98.6 ??F (37 ??C) (!) 105 18 (!) 144/85 99 %   02/03/19 2000 98.1 ??F (36.7 ??C) (!) 110 18 120/79 98 %   02/03/19 1830 97.9 ??F (36.6 ??C) (!) 104 16 124/78 100 %   02/03/19 1746 97.7 ??F (36.5 ??C) (!) 106 16 125/70 97 %   02/03/19 1710 ??? (!) 109 16 116/64 98 %   02/03/19 1655 ??? (!) 104 16 (!) 115/59 99 %   02/03/19 1640 97.4 ??F (36.3 ??C) 99 16 (!) 112/55 100 %   02/03/19 1625 97.6 ??F (36.4 ??C) (!) 104 16 122/66 ???   02/03/19 1610 ??? 100 16 (!) 135/94 100 %   02/03/19 1605 ??? 91 16 135/89 100 %   02/03/19 1600 ??? 90 16 127/77 100 %   02/03/19 1555 ??? 91 16 133/83 100 %   02/03/19 1550 ??? 96 16 (!) 139/90 100 %   02/03/19 1542 98.6 ??F (37 ??C) 96 15 (!) 140/87 99 %   02/03/19 1309 ??? 96 ??? 137/84 ???   02/03/19 1239 ??? 83 ??? (!) 142/86 ???       Exam:  Patient without distress.               Abdomen soft, fundus firm at level of umbilicus, non tender.                 Incision dry and clean without erythema.               Lower extremities are negative for swelling, cords or tenderness.        Recent Labs     02/04/19  0655 02/02/19  1251 01/31/19  1016 01/28/19  0635 01/26/19  0451 01/24/19  0942 01/23/19  0707 01/22/19  1950 12/17/18  1010 10/28/18  1748 09/10/18  1044   WBC  --  13.2* 11.3* 10.1 9.2 10.0 8.4 8.9 9.6 10.4 6.9   HGB 8.7* 12.5 10.2* 9.0* 8.7* 9.2* 9.6* 10.1* 11.0* 12.0 12.0   HCT 27.8* 39.6 32.0* 28.7* 27.3* 28.7* 29.6* 31.1* 33.2* 35.6* 34.5   PLT  --  258 216 222 216 224 229 245 218 241 216        Recent Labs     01/31/19  1016 01/28/19  0635 01/26/19  0451 01/24/19  0942 01/23/19  0707 01/22/19  1950 12/27/18   1145 12/27/18  0943 12/17/18  1010 11/06/18  0837 10/28/18  1748 09/10/18  1044   NA 138 140 142 141 142 139  --  143 143 139 139 137   K 3.4* 3.4* 2.7* 2.5* 2.6* 2.4*  --  3.5 3.6 3.6 2.8* 3.7   CL 107 109* 109* 108* 109* 105  --  107* 107* 105 106 104   CO2 24 24 24 23 24 25   --  21 21 18* 24 20   AGAP 7 7 9 10 9 9   --   --   --   --  9  --    GLU  114* 85 91 170* 104* 74 81 174* 161* 103* 95 86   BUN 5* 5* 4* 3* 3* 3*  --  5* 4* 6 4* 6   CREA 0.38* 0.27* 0.26* 0.39* 0.36* 0.40*  --  0.40* 0.37* 0.41* 0.44* 0.46*   GFRAA >60 >60 >60 >60 >60 >60  --  163 167 161 >60 157   GFRNA >60 >60 >60 >60 >60 >60  --  141 145 140 >60 136   CA 8.8 8.6 8.1* 8.2* 8.8 8.5  --  9.2 9.0 9.3 9.1 9.2   MG  --   --   --   --   --   --   --   --   --  1.7  --   --    ALB 2.8* 2.6* 2.5* 2.9* 2.9* 3.1*  --  3.7* 3.7* 3.7* 3.0* 4.0   TP 6.0* 5.5* 5.5* 6.0* 6.4 6.8  --  5.7* 5.9* 5.9* 6.8 6.1   GLOB 3.2 2.9 3.0 3.1 3.5 3.7*  --   --   --   --  3.8*  --    AGRAT 0.9* 0.9* 0.8* 0.9* 0.8* 0.8*  --  1.9 1.7 1.7 0.8* 1.9   AST 56* 46* 37 38* 55* 59*  --  19 20 21 23 13    ALT 48 42 36 39 43 47  --  13 14 26  36 9       Recent Labs     01/26/19  0451 January 31, 2019  0942 01/22/19  2205 01/22/19  1950 09/10/18  1044   URICA 3.6 3.9  --  4.8 3.9   LDH 152 174  --   --  130   PUQ  --   --  363  --   --           Recent Labs     01/31/19  1016 01/28/19  0635 01/26/19  0451 2019/01/31  0942 01/23/19  0707 01/22/19  1950 12/27/18  1145 12/27/18  0943 12/17/18  1010 11/06/18  0837 10/28/18  1748 09/10/18  1044   GLU 114* 85 91 170* 104* 74 81 174* 161* 103* 95 86       Problem List  Date Reviewed: Jan 31, 2019          Codes Class Noted    * (Principal) Mild preeclampsia ICD-10-CM: O14.00  ICD-9-CM: 642.40  01/23/2019    Overview Signed 01/31/2019  8:59 AM by Newt Minion, MD     [facility]- Mild preE with significant history and mild transaminitis bump, remain inpt with close surveillance. Not a candidate for home management at this time.               Fall at home ICD-10-CM: W19.Clement Sayres  ICD-9-CM: [id number], E849.0  01/22/2019        Pruritus ICD-10-CM: L29.9  ICD-9-CM: 698.9  12/27/2018        Hypokalemia ICD-10-CM: E87.6  ICD-9-CM: 276.8  11/06/2018    Overview Addendum 12/18/2018 11:03 AM by Rosebud Poles L, NP     10/28/2018: K+ 2.8 mEq in ER, received 40 mEQ IV    12/18/2018: K+ 3.6             Heart palpitations ICD-10-CM: R00.2  ICD-9-CM: 785.1  11/06/2018        Multigravida in third trimester ICD-10-CM: Z34.83  ICD-9-CM: V22.1  09/09/2018    Overview Addendum 12/17/2018  9:04 AM by Marcelino Freestone, NP  EDD 02/23/2019 by LMP c/w 16wk [place]    09/10/2018: AFP, CF, SMA negative  12/17/2018: Flu vaccine/TDAP recommended, Plans breastfeeding, desires BTL             Asthma during pregnancy ICD-10-CM: O99.519, J45.909  ICD-9-CM: 648.93, 493.90  09/09/2018    Overview Signed 09/09/2018  3:22 PM by Vernell Barrier, NP     Rare inhaler use (1-2/year)             History of pre-eclampsia in prior pregnancy, currently pregnant in third trimester ICD-10-CM: O09.293  ICD-9-CM: V23.49  09/09/2018    Overview Addendum 09/13/2018  7:32 AM by Vernell Barrier, NP     Hx of preeclampsia (requiring delivery at 36 weeks) and a Hx of postpartum preeclampsia  Baseline pre-e labs/urine, Start ASA 162 mg daily    09/11/2018: 24 hr urine protein 158                     Current Facility-Administered Medications   Medication Dose Route Frequency   ??? oxyCODONE IR (ROXICODONE) tablet 15 mg  15 mg Oral Q4H PRN   ??? ketorolac (TORADOL) injection 30 mg  30 mg IntraVENous Q6H PRN   ??? dextrose 5% lactated ringers infusion 1,000 mL  1,000 mL IntraVENous CONTINUOUS   ??? sodium chloride (NS) flush 5-40 mL  5-40 mL IntraVENous Q8H   ??? sodium chloride (NS) flush 5-40 mL  5-40 mL IntraVENous PRN   ??? naloxone (NARCAN) injection 0.4 mg  0.4 mg IntraVENous PRN   ??? ondansetron (ZOFRAN) injection 4 mg  4 mg IntraVENous Q4H PRN   ??? simethicone (MYLICON) tablet 80 mg  80 mg Oral QID PRN    ??? docusate sodium (COLACE) capsule 100 mg  100 mg Oral BID   ??? diphenhydrAMINE (BENADRYL) capsule 25-50 mg  25-50 mg Oral Q6H PRN   ??? diph,Pertuss(AC),Tet Vac-PF (BOOSTRIX) suspension 0.5 mL  0.5 mL IntraMUSCular PRIOR TO DISCHARGE   ??? zolpidem (AMBIEN) tablet 10 mg  10 mg Oral QHS PRN   ??? famotidine (PEPCID) tablet 20 mg  20 mg Oral DAILY   ??? HYDROmorphone (PF) (DILAUDID) injection 0.5 mg  0.5 mg IntraVENous Q2H PRN   ??? potassium chloride (K-DUR, KLOR-CON) SR tablet 40 mEq  40 mEq Oral TID       OB History   Gravida Para Term Preterm AB Living   SAB TAB Ectopic Molar Multiple Live Births   0 0 0 0 0 4      # Outcome Date GA Lbr Len/2nd Weight Sex Delivery Anes PTL Lv   6 Term 02/03/19 [redacted]w[redacted]d   F CS-LTranv Gen N LIV      Complications: Other (comment)      Name: Donley,GIRL  ANGEL C      Apgar1: 3  Apgar5: 7   5 Term 2017 [redacted]w[redacted]d  7 lb (3.175 kg)  Vag-Spont  N LIV      Complications: Preeclampsia in postpartum period   4 Term 2015 [redacted]w[redacted]d  6 lb (2.722 kg)  Vag-Spont  N LIV      Complications: Abruptio Placenta   3 Preterm 2012 [redacted]w[redacted]d  5 lb 9 oz (2.523 kg) M Vag-Spont  N LIV      Complications: Preeclampsia   2 AB            1 AB  Assessment and Plan:    POD 1:  Patient appears to be having uncomplicated post-cesarean course.    Baby on CPAP @ [facility].  Pt requests transfer to [facility].    Wants tubal ligation, pt advised to d/w physician @ [facility]     Has not been successful w/ breastfeeding, strategies reviewed.    SIDs  precautions reviewed    Pt strongly encouraged to walk, goal:  Walk the entire hallway 1-3x/day.     ABO Group   Date Value Ref Range Status   09/10/2018 O  Final     Rh (D)   Date Value Ref Range Status   09/10/2018 Positive  Final     Comment:     Please note: Prior records for this patient's ABO / Rh type are not  available for additional verification.       Rubella Ab, IgG   Date Value Ref Range Status   09/10/2018 2.93 Immune >0.99 index Final     Comment:                                      Non-immune       <0.90                                  Equivocal  0.90 - 0.99                                  Immune           >0.99

## 2019-02-04 NOTE — Progress Notes (Signed)
[facility] arrived to mom/baby unit to transport mother to [facility].

## 2019-02-04 NOTE — Discharge Summary (Signed)
Obstetrical Discharge Summary     Name: Debra Warren MRN: [id number]  SSN: [id number]    Date of Birth: Sep 17, 1990  Age: 28 y.o.  Sex: female      Allergies: Patient has no known allergies.    Admit Date: 01/22/2019    Discharge Date: 02/04/2019     Admitting Physician: Aldean Baker, MD     Attending Physician:  Aldean Baker, MD     * Admission Diagnoses: Fall at home [phone], K02.542];Pregnancy [Z34.90]    * Discharge Diagnoses:   Information for the patient's newborn:  Hayzlee, Mcsorley [[id number]]   Delivery of a   female infant via C-Section, Low Transverse on 02/03/2019 at 2:03 PM  by [facility] Melia. Apgars were 3  and 7 .       Additional Diagnoses:   Hospital Problems as of 02/04/2019 Date Reviewed: 02-11-19          Codes Class Noted - Resolved POA    * (Principal) Mild preeclampsia ICD-10-CM: O14.00  ICD-9-CM: 642.40  01/23/2019 - Present Yes    Overview Signed 11-Feb-2019  8:59 AM by Newt Minion, MD     [facility]- Mild preE with significant history and mild transaminitis bump, remain inpt with close surveillance. Not a candidate for home management at this time.              Fall at home ICD-10-CM: W19.Clement Sayres  ICD-9-CM: E888.9, E849.0  01/22/2019 - Present Yes        Multigravida in third trimester ICD-10-CM: Z34.83  ICD-9-CM: V22.1  09/09/2018 - Present Yes    Overview Addendum 12/17/2018  9:04 AM by Marcelino Freestone, NP     EDD 02/23/2019 by LMP c/w 16wk [place]    09/10/2018: AFP, CF, SMA negative  12/17/2018: Flu vaccine/TDAP recommended, Plans breastfeeding, desires BTL             History of pre-eclampsia in prior pregnancy, currently pregnant in third trimester ICD-10-CM: O09.293  ICD-9-CM: V23.49  09/09/2018 - Present Yes    Overview Addendum 09/13/2018  7:32 AM by Marcelino Freestone, NP     Hx of preeclampsia (requiring delivery at 36 weeks) and a Hx of postpartum preeclampsia  Baseline pre-e labs/urine, Start ASA 162 mg daily    09/11/2018: 24 hr urine protein 158              RESOLVED: Pregnancy ICD-10-CM: Z34.90  ICD-9-CM: V22.2  01/25/2019 - 01/27/2019 Unknown             Lab Results   Component Value Date/Time    ABO/Rh(D) Jenetta Downer POSITIVE 02/02/2019 12:51 PM      Immunization History   Administered Date(s) Administered   ??? DTP 11/27/1989, 02/12/1990, 04/30/1990, 12/16/1990   ??? DTaP 10/17/1993   ??? HPV (Quad) 11/26/2006   ??? Hib 11/27/1989, 02/12/1990, 04/30/1990, 12/16/1990   ??? IPV 12/16/1990, 10/17/1993   ??? Influenza Vaccine 12/22/2003   ??? Influenza Vaccine (Quad) PF (>6 Mo Flulaval, Fluarix, and >3 Yrs [place], Fluzone 31517) 11/29/2012, 02/15/2015   ??? MMR 01/06/1991, 10/17/1993   ??? OPV 11/27/1989, 02/12/1990   ??? TB Skin Test (PPD) Intradermal 06/08/2016   ??? Td 10/17/2001   ??? Tdap 02/21/2013, 02/15/2015       * Procedures:    Procedure(s) with comments:  CESAREAN SECTION - edd 02/23/2019 Cord prolapse primary Cesarean Section           * Discharge Condition: good    *  Hospital Course: Normal hospital course following the delivery.    * Disposition: [facility]    Discharge Medications:   Current Discharge Medication List      STOP taking these medications       PNV no.153/FA/om3/dha/epa/fish (PRENATAL GUMMIES PO) Comments:   Reason for Stopping:         aspirin delayed-release 81 mg tablet Comments:   Reason for Stopping:               * Follow-up Care/Patient Instructions:  Activity: No lifting, Driving, or Strenuous exercise for 2-4 weeks, No sex for 6 weeks, No driving while on narcotic analgesics.  Ambulate/long walks tid to enhance pain control.   Diet: Regular Diet  Wound Care: As directed    Follow-up Information     Follow up With Specialties Details Why Contact Info    None    None ([phone]) Patient stated that they have no PCP

## 2019-02-04 NOTE — Progress Notes (Signed)
02/04/19 0442   Pain 1   Pain Scale 1 Numeric (0 - 10)   Pain Intensity 1 6   Patient Stated Pain Goal 4   Pain Location 1 Abdomen;Incisional   Pain Orientation 1 Lower   Pain Description 1 Sore   Pain Intervention(s) 1 Medication (see MAR)   Patient Observation   Observations tylenol and oxycodone given for pain   Activity In bed;Family/Visitors present

## 2019-02-04 NOTE — Progress Notes (Signed)
Spoke to Dr. Luciano Cutter. Pain medications adjusted and permission to pull foley catheter.

## 2019-02-04 NOTE — Progress Notes (Signed)
Patient up to bathroom with RN assistance.  Peri-care taught and completed. Questions encouraged and answered.  Patient back to bed, encouraged to call for needs or concerns. Verbalizes understanding.         Foley still in per Dr. Mellody Dance order. Pt only had 50 ml urine output after bolus. SCDs on. Patient educated to call out for assistance to bathroom and prn. Patient verbalized understanding of education.

## 2019-02-04 NOTE — Progress Notes (Signed)
 Chart reviewed - baby Ophelia transferred to [facility] NICU yesterday.      SW met with patient while social distancing and wearing appropriate PPE.    Emotional support provided regarding baby's need to transfer to [facility].  Family states that they feel that they are coping well during this time.  Patient is hopeful to be transferred to [facility] later today to be with baby Ophelia.    Family denied any needs from Child psychotherapist at this time.  SW will continue to follow.    Almarie EUSTACE Anon, LISW-CP  [facility] [place]

## 2019-02-04 NOTE — Consults (Signed)
In to see mom prior to her transferring to [facility]. She stated that she has just pumped and expressed a few drops. Encouraged her to continue continue to pump every 2-3 hours and to take her breast pump kit home with her. Informed her that she will be provided a breast pump to use while a patient in the hospital. Lactation consultants are available at hospital to assist her.

## 2019-02-04 NOTE — Progress Notes (Signed)
Patient has minimum output about 17ml/hr over 8 hours and concentrated color from orange to dark amber. Patient has not much to drink. Dr. Mellody Dance called and updated on patient output status. Order received to admin bolus of  IV LR over 60 minutes and leave foley in until MD makes round this am. Verbal readback provided.

## 2019-02-04 NOTE — Anesthesia Post-Procedure Evaluation (Signed)
Procedure(s):  CESAREAN SECTION.    general    Anesthesia Post Evaluation      Multimodal analgesia: multimodal analgesia not used between 6 hours prior to anesthesia start to PACU discharge  Patient location during evaluation: PACU  Patient participation: complete - patient participated  Level of consciousness: awake and alert  Pain management: adequate  Airway patency: patent  Anesthetic complications: no  Cardiovascular status: hemodynamically stable  Respiratory status: acceptable  Hydration status: acceptable        INITIAL Post-op Vital signs:   Vitals Value Taken Time   BP 135/86 02/04/19 0315   Temp 36.6 ??C (97.9 ??F) 02/04/19 0315   Pulse 105 02/04/19 0315   Resp 16 02/04/19 0315   SpO2 97 % 02/04/19 0315

## 2019-02-04 NOTE — Progress Notes (Signed)
Gave report to [place] at [facility]. Pt to transfer to room 6332.

## 2019-02-04 NOTE — Progress Notes (Signed)
[facility] arrived to [employer] unit to transport mother to [facility].

## 2019-02-04 NOTE — Progress Notes (Signed)
Assisted pt up to restroom. Pt unable to void. Encouraged pt to drink PO fluids. Pt ambulated to chair and will retry to void in 1 hour. RN to reassess.

## 2019-02-04 NOTE — Progress Notes (Signed)
Assessment complete. Pt's IV site wet.  Serous sanguinous fluid noted to be leaking out of IV site. Wet dressing removed and dry dressing applied. IV fluids restarted and site remains leaking fluid. IV repositioned, but remains leaking. Pt denies pain. IV removed. Encouraged PO fluids. Pt verbalized understanding. Foley catheter removed. Pt tolerated well.

## 2019-02-04 NOTE — Progress Notes (Signed)
Post-Operative Day Number 1 Progress Note    Patient doing well post-op day 1 from cesarean delivery without significant complaints.  Pain controlled on current medication.  Voiding without difficulty, normal lochia.    Patient Vitals for the past 24 hrs:   Temp Pulse Resp BP SpO2   02/04/19 0830 98.2 ??F (36.8 ??C) 97 16 117/71 100 %   02/04/19 0315 97.9 ??F (36.6 ??C) (!) 105 16 135/86 97 %   02/03/19 2246 98.6 ??F (37 ??C) (!) 105 18 (!) 144/85 99 %   02/03/19 2000 98.1 ??F (36.7 ??C) (!) 110 18 120/79 98 %   02/03/19 1830 97.9 ??F (36.6 ??C) (!) 104 16 124/78 100 %   02/03/19 1746 97.7 ??F (36.5 ??C) (!) 106 16 125/70 97 %   02/03/19 1710 ??? (!) 109 16 116/64 98 %   02/03/19 1655 ??? (!) 104 16 (!) 115/59 99 %   02/03/19 1640 97.4 ??F (36.3 ??C) 99 16 (!) 112/55 100 %   02/03/19 1625 97.6 ??F (36.4 ??C) (!) 104 16 122/66 ???   02/03/19 1610 ??? 100 16 (!) 135/94 100 %   02/03/19 1605 ??? 91 16 135/89 100 %   02/03/19 1600 ??? 90 16 127/77 100 %   02/03/19 1555 ??? 91 16 133/83 100 %   02/03/19 1550 ??? 96 16 (!) 139/90 100 %   02/03/19 1542 98.6 ??F (37 ??C) 96 15 (!) 140/87 99 %   02/03/19 1309 ??? 96 ??? 137/84 ???   02/03/19 1239 ??? 83 ??? (!) 142/86 ???       Exam:  Patient without distress.               Abdomen soft, fundus firm at level of umbilicus, non tender.                 Incision dry and clean without erythema.               Lower extremities are negative for swelling, cords or tenderness.        Recent Labs     02/04/19  0655 02/02/19  1251 01/31/19  1016 01/28/19  0635 01/26/19  0451 01/24/19  0942 01/23/19  0707 01/22/19  1950 12/17/18  1010 10/28/18  1748 09/10/18  1044   WBC  --  13.2* 11.3* 10.1 9.2 10.0 8.4 8.9 9.6 10.4 6.9   HGB 8.7* 12.5 10.2* 9.0* 8.7* 9.2* 9.6* 10.1* 11.0* 12.0 12.0   HCT 27.8* 39.6 32.0* 28.7* 27.3* 28.7* 29.6* 31.1* 33.2* 35.6* 34.5   PLT  --  258 216 222 216 224 229 245 218 241 216        Recent Labs     01/31/19  1016 01/28/19  0635 01/26/19  0451 01/24/19  0942 01/23/19  0707 01/22/19  1950 12/27/18  1145  12/27/18  0943 12/17/18  1010 11/06/18  0837 10/28/18  1748 09/10/18  1044   NA 138 140 142 141 142 139  --  143 143 139 139 137   K 3.4* 3.4* 2.7* 2.5* 2.6* 2.4*  --  3.5 3.6 3.6 2.8* 3.7   CL 107 109* 109* 108* 109* 105  --  107* 107* 105 106 104   CO2 24 24 24 23 24 25   --  21 21 18* 24 20   AGAP 7 7 9 10 9 9   --   --   --   --  9  --    GLU  114* 85 91 170* 104* 74 81 174* 161* 103* 95 86   BUN 5* 5* 4* 3* 3* 3*  --  5* 4* 6 4* 6   CREA 0.38* 0.27* 0.26* 0.39* 0.36* 0.40*  --  0.40* 0.37* 0.41* 0.44* 0.46*   GFRAA >60 >60 >60 >60 >60 >60  --  163 167 161 >60 157   GFRNA >60 >60 >60 >60 >60 >60  --  141 145 140 >60 136   CA 8.8 8.6 8.1* 8.2* 8.8 8.5  --  9.2 9.0 9.3 9.1 9.2   MG  --   --   --   --   --   --   --   --   --  1.7  --   --    ALB 2.8* 2.6* 2.5* 2.9* 2.9* 3.1*  --  3.7* 3.7* 3.7* 3.0* 4.0   TP 6.0* 5.5* 5.5* 6.0* 6.4 6.8  --  5.7* 5.9* 5.9* 6.8 6.1   GLOB 3.2 2.9 3.0 3.1 3.5 3.7*  --   --   --   --  3.8*  --    AGRAT 0.9* 0.9* 0.8* 0.9* 0.8* 0.8*  --  1.9 1.7 1.7 0.8* 1.9   AST 56* 46* 37 38* 55* 59*  --  ALT 48 42 36 39 43 47  --  36 9       Recent Labs     01/26/19  0451 Feb 10, 2019  0942 01/22/19  2205 01/22/19  1950 09/10/18  1044   URICA 3.6 3.9  --  4.8 3.9   LDH 152 174  --   --  130   PUQ  --   --  363  --   --           Recent Labs     01/31/19  1016 01/28/19  0635 01/26/19  0451 2019/02/10  0942 01/23/19  0707 01/22/19  1950 12/27/18  1145 12/27/18  0943 12/17/18  1010 11/06/18  0837 10/28/18  1748 09/10/18  1044   GLU 114* 85 91 170* 104* 74 81 174* 161* 103* 95 86       Problem List  Date Reviewed: 2019-02-10          Codes Class Noted    * (Principal) Mild preeclampsia ICD-10-CM: O14.00  ICD-9-CM: 642.40  01/23/2019    Overview Signed 02-10-2019  8:59 AM by Sharol Given, MD     [facility]- Mild preE with significant history and mild transaminitis bump, remain inpt with close surveillance. Not a candidate for home management at this time.              Fall at home  ICD-10-CM: W19.Carlus Pavlov  ICD-9-CM: [id number], E849.0  01/22/2019        Pruritus ICD-10-CM: L29.9  ICD-9-CM: 698.9  12/27/2018        Hypokalemia ICD-10-CM: E87.6  ICD-9-CM: 276.8  11/06/2018    Overview Addendum 12/18/2018 11:03 AM by Memory Argue L, NP     10/28/2018: K+ 2.8 mEq in ER, received 40 mEQ IV    12/18/2018: K+ 3.6             Heart palpitations ICD-10-CM: R00.2  ICD-9-CM: 785.1  11/06/2018        Multigravida in third trimester ICD-10-CM: Z34.83  ICD-9-CM: V22.1  09/09/2018    Overview Addendum 12/17/2018  9:04 AM by Vernell Barrier, NP  EDD 02/23/2019 by LMP c/w 16wk [place]    09/10/2018: AFP, CF, SMA negative  12/17/2018: Flu vaccine/TDAP recommended, Plans breastfeeding, desires BTL             Asthma during pregnancy ICD-10-CM: O99.519, J45.909  ICD-9-CM: 648.93, 493.90  09/09/2018    Overview Signed 09/09/2018  3:22 PM by Marcelino Freestone, NP     Rare inhaler use (1-2/year)             History of pre-eclampsia in prior pregnancy, currently pregnant in third trimester ICD-10-CM: O09.293  ICD-9-CM: V23.49  09/09/2018    Overview Addendum 09/13/2018  7:32 AM by Marcelino Freestone, NP     Hx of preeclampsia (requiring delivery at 36 weeks) and a Hx of postpartum preeclampsia  Baseline pre-e labs/urine, Start ASA 162 mg daily    09/11/2018: 24 hr urine protein 158                     Current Facility-Administered Medications   Medication Dose Route Frequency   ??? oxyCODONE IR (ROXICODONE) tablet 15 mg  15 mg Oral Q4H PRN   ??? ketorolac (TORADOL) injection 30 mg  30 mg IntraVENous Q6H PRN   ??? dextrose 5% lactated ringers infusion 1,000 mL  1,000 mL IntraVENous CONTINUOUS   ??? sodium chloride (NS) flush 5-40 mL  5-40 mL IntraVENous Q8H   ??? sodium chloride (NS) flush 5-40 mL  5-40 mL IntraVENous PRN   ??? naloxone (NARCAN) injection 0.4 mg  0.4 mg IntraVENous PRN   ??? ondansetron (ZOFRAN) injection 4 mg  4 mg IntraVENous Q4H PRN   ??? simethicone (MYLICON) tablet 80 mg  80 mg Oral QID PRN   ??? docusate sodium (COLACE) capsule 100 mg  100  mg Oral BID   ??? diphenhydrAMINE (BENADRYL) capsule 25-50 mg  25-50 mg Oral Q6H PRN   ??? diph,Pertuss(AC),Tet Vac-PF (BOOSTRIX) suspension 0.5 mL  0.5 mL IntraMUSCular PRIOR TO DISCHARGE   ??? zolpidem (AMBIEN) tablet 10 mg  10 mg Oral QHS PRN   ??? famotidine (PEPCID) tablet 20 mg  20 mg Oral DAILY   ??? HYDROmorphone (PF) (DILAUDID) injection 0.5 mg  0.5 mg IntraVENous Q2H PRN   ??? potassium chloride (K-DUR, KLOR-CON) SR tablet 40 mEq  40 mEq Oral TID       OB History   Gravida Para Term Preterm AB Living   6 4 3 1 2 4    SAB TAB Ectopic Molar Multiple Live Births   0 0 0 0 0 4      # Outcome Date GA Lbr Len/2nd Weight Sex Delivery Anes PTL Lv   6 Term 02/03/19 [redacted]w[redacted]d   F CS-LTranv Gen N LIV      Complications: Other (comment)      Name: Liddicoat,GIRL  ANGEL C      Apgar1: 3  Apgar5: 7   5 Term 2017 [redacted]w[redacted]d  7 lb (3.175 kg)  Vag-Spont  N LIV      Complications: Preeclampsia in postpartum period   4 Term 2015 [redacted]w[redacted]d  6 lb (2.722 kg)  Vag-Spont  N LIV      Complications: Abruptio Placenta   3 Preterm 2012 [redacted]w[redacted]d  5 lb 9 oz (2.523 kg) M Vag-Spont  N LIV      Complications: Preeclampsia   2 AB            1 AB  Assessment and Plan:    POD 1:  Patient appears to be having uncomplicated post-cesarean course.    Baby on CPAP @ [facility].  Pt requests transfer to [facility].    Wants tubal ligation, pt advised to d/w physician @ [facility]     Has not been successful w/ breastfeeding, strategies reviewed.    SIDs  precautions reviewed    Pt strongly encouraged to walk, goal:  Walk the entire hallway 1-3x/day.     ABO Group   Date Value Ref Range Status   09/10/2018 O  Final     Rh (D)   Date Value Ref Range Status   09/10/2018 Positive  Final     Comment:     Please note: Prior records for this patient's ABO / Rh type are not  available for additional verification.       Rubella Ab, IgG   Date Value Ref Range Status   09/10/2018 2.93 Immune >0.99 index Final     Comment:                                     Non-immune       <0.90                                   Equivocal  0.90 - 0.99                                  Immune           >0.99

## 2019-02-04 NOTE — Progress Notes (Signed)
Pt up to restroom and voided 150 ml of amber urine. Encouraged fluid intake. Gave report to [facility] about pt transport to [facility].

## 2019-02-04 NOTE — Progress Notes (Signed)
 Report received from [facility] RN, care assumed.

## 2019-02-04 NOTE — Progress Notes (Signed)
Pt left unit with Electrical engineer.

## 2019-02-04 NOTE — Discharge Summary (Signed)
Obstetrical Discharge Summary     Name: CIENNA DUMAIS MRN: [id number]  SSN: [id number]    Date of Birth: 10/19/1990  Age: 28 y.o.  Sex: female      Allergies: Patient has no known allergies.    Admit Date: 01/22/2019    Discharge Date: 02/04/2019     Admitting Physician: Aldean Baker, MD     Attending Physician:  Aldean Baker, MD     * Admission Diagnoses: Fall at home [phone], Q00.867];Pregnancy [Z34.90]    * Discharge Diagnoses:   Information for the patient's newborn:  Tayra, Dawe [[id number]]   Delivery of a   female infant via C-Section, Low Transverse on 02/03/2019 at 2:03 PM  by [facility] Melia. Apgars were 3  and 7 .       Additional Diagnoses:   Hospital Problems as of 02/04/2019 Date Reviewed: 2019-01-26          Codes Class Noted - Resolved POA    * (Principal) Mild preeclampsia ICD-10-CM: O14.00  ICD-9-CM: 642.40  01/23/2019 - Present Yes    Overview Signed 01-26-2019  8:59 AM by Newt Minion, MD     [facility]- Mild preE with significant history and mild transaminitis bump, remain inpt with close surveillance. Not a candidate for home management at this time.              Fall at home ICD-10-CM: W19.Clement Sayres  ICD-9-CM: E888.9, E849.0  01/22/2019 - Present Yes        Multigravida in third trimester ICD-10-CM: Z34.83  ICD-9-CM: V22.1  09/09/2018 - Present Yes    Overview Addendum 12/17/2018  9:04 AM by Marcelino Freestone, NP     EDD 02/23/2019 by LMP c/w 16wk [place]    09/10/2018: AFP, CF, SMA negative  12/17/2018: Flu vaccine/TDAP recommended, Plans breastfeeding, desires BTL             History of pre-eclampsia in prior pregnancy, currently pregnant in third trimester ICD-10-CM: O09.293  ICD-9-CM: V23.49  09/09/2018 - Present Yes    Overview Addendum 09/13/2018  7:32 AM by Marcelino Freestone, NP     Hx of preeclampsia (requiring delivery at 36 weeks) and a Hx of postpartum preeclampsia  Baseline pre-e labs/urine, Start ASA 162 mg daily    09/11/2018: 24 hr urine protein 158             RESOLVED:  Pregnancy ICD-10-CM: Z34.90  ICD-9-CM: V22.2  01/25/2019 - 01/27/2019 Unknown             Lab Results   Component Value Date/Time    ABO/Rh(D) Jenetta Downer POSITIVE 02/02/2019 12:51 PM      Immunization History   Administered Date(s) Administered   ??? DTP 11/27/1989, 02/12/1990, 04/30/1990, 12/16/1990   ??? DTaP 10/17/1993   ??? HPV (Quad) 11/26/2006   ??? Hib 11/27/1989, 02/12/1990, 04/30/1990, 12/16/1990   ??? IPV 12/16/1990, 10/17/1993   ??? Influenza Vaccine 12/22/2003   ??? Influenza Vaccine (Quad) PF (>6 Mo Flulaval, Fluarix, and >3 Yrs [place], Fluzone [id number]) 11/29/2012, 02/15/2015   ??? MMR 01/06/1991, 10/17/1993   ??? OPV 11/27/1989, 02/12/1990   ??? TB Skin Test (PPD) Intradermal 06/08/2016   ??? Td 10/17/2001   ??? Tdap 02/21/2013, 02/15/2015       * Procedures:    Procedure(s) with comments:  CESAREAN SECTION - edd 02/23/2019 Cord prolapse primary Cesarean Section           * Discharge Condition: good    *  Hospital Course: Normal hospital course following the delivery.    * Disposition: [facility]    Discharge Medications:   Current Discharge Medication List      STOP taking these medications       PNV no.153/FA/om3/dha/epa/fish (PRENATAL GUMMIES PO) Comments:   Reason for Stopping:         aspirin delayed-release 81 mg tablet Comments:   Reason for Stopping:               * Follow-up Care/Patient Instructions:  Activity: No lifting, Driving, or Strenuous exercise for 2-4 weeks, No sex for 6 weeks, No driving while on narcotic analgesics.  Ambulate/long walks tid to enhance pain control.   Diet: Regular Diet  Wound Care: As directed    Follow-up Information     Follow up With Specialties Details Why Contact Info    None    None ([phone]) Patient stated that they have no PCP

## 2019-02-08 ENCOUNTER — Inpatient Hospital Stay
Admit: 2019-02-08 | Discharge: 2019-02-09 | Disposition: A | Discharge status: Home / Self Care | Payer: BLUE CROSS/BLUE SHIELD | Attending: Emergency Medicine

## 2019-02-08 ENCOUNTER — Emergency Department: Admit: 2019-02-08 | Payer: BLUE CROSS/BLUE SHIELD

## 2019-02-08 DIAGNOSIS — N3000 Acute cystitis without hematuria: Secondary | ICD-10-CM

## 2019-02-08 LAB — CBC WITH AUTO DIFFERENTIAL
Basophils %: 0 % (ref 0.0–2.0)
Basophils Absolute: 0 10*3/uL (ref 0.0–0.2)
Eosinophils %: 1 % (ref 0.5–7.8)
Eosinophils Absolute: 0 10*3/uL (ref 0.0–0.8)
Granulocyte Absolute Count: 0.2 10*3/uL (ref 0.0–0.5)
Hematocrit: 22.8 % — ABNORMAL LOW (ref 35.8–46.3)
Hemoglobin: 7.1 g/dL — ABNORMAL LOW (ref 11.7–15.4)
Immature Granulocytes: 4 % (ref 0.0–5.0)
Lymphocytes %: 9 % — ABNORMAL LOW (ref 13–44)
Lymphocytes Absolute: 0.6 10*3/uL (ref 0.5–4.6)
MCH: 25.8 PG — ABNORMAL LOW (ref 26.1–32.9)
MCHC: 31.1 g/dL — ABNORMAL LOW (ref 31.4–35.0)
MCV: 82.9 FL (ref 79.6–97.8)
MPV: 10.4 FL (ref 9.4–12.3)
Monocytes %: 9 % (ref 4.0–12.0)
Monocytes Absolute: 0.5 10*3/uL (ref 0.1–1.3)
NRBC Absolute: 0.05 10*3/uL (ref 0.0–0.2)
Neutrophils %: 77 % (ref 43–78)
Neutrophils Absolute: 4.7 10*3/uL (ref 1.7–8.2)
Platelets: 264 10*3/uL (ref 150–450)
RBC: 2.75 M/uL — ABNORMAL LOW (ref 4.05–5.2)
RDW: 19.2 % — ABNORMAL HIGH (ref 11.9–14.6)
WBC: 6 10*3/uL (ref 4.3–11.1)

## 2019-02-08 LAB — COMPREHENSIVE METABOLIC PANEL
ALT: 65 U/L (ref 12–65)
AST: 131 U/L — ABNORMAL HIGH (ref 15–37)
Albumin/Globulin Ratio: 0.8 — ABNORMAL LOW (ref 1.2–3.5)
Albumin: 2.4 g/dL — ABNORMAL LOW (ref 3.5–5.0)
Alkaline Phosphatase: 151 U/L — ABNORMAL HIGH (ref 50–130)
Anion Gap: 10 mmol/L (ref 7–16)
BUN: 15 MG/DL (ref 6–23)
CO2: 22 mmol/L (ref 21–32)
Calcium: 8.9 MG/DL (ref 8.3–10.4)
Chloride: 110 mmol/L — ABNORMAL HIGH (ref 98–107)
Creatinine: 0.37 MG/DL — ABNORMAL LOW (ref 0.6–1.0)
EGFR IF NonAfrican American: >60 mL/min/{1.73_m2} (ref >60)
GFR African American: >60 mL/min/{1.73_m2} (ref >60)
Globulin: 3.2 g/dL (ref 2.3–3.5)
Glucose: 82 mg/dL (ref 65–100)
Potassium: 3.8 mmol/L (ref 3.5–5.1)
Sodium: 142 mmol/L (ref 136–145)
Total Bilirubin: 1.1 MG/DL (ref 0.2–1.1)
Total Protein: 5.6 g/dL — ABNORMAL LOW (ref 6.3–8.2)

## 2019-02-08 LAB — LACTIC ACID
Lactic Acid: 0.8 MMOL/L (ref 0.4–2.0)
Lactic acid: 0.8 MMOL/L (ref 0.4–2.0)

## 2019-02-08 LAB — CBC WITH AUTOMATED DIFF
ABS. BASOPHILS: 0 10*3/uL (ref 0.0–0.2)
ABS. EOSINOPHILS: 0 10*3/uL (ref 0.0–0.8)
ABS. IMM. GRANS.: 0.2 10*3/uL (ref 0.0–0.5)
ABS. LYMPHOCYTES: 0.6 10*3/uL (ref 0.5–4.6)
ABS. MONOCYTES: 0.5 10*3/uL (ref 0.1–1.3)
ABS. NEUTROPHILS: 4.7 10*3/uL (ref 1.7–8.2)
ABSOLUTE NRBC: 0.05 10*3/uL (ref 0.0–0.2)
BASOPHILS: 0 % (ref 0.0–2.0)
EOSINOPHILS: 1 % (ref 0.5–7.8)
HCT: 22.8 % — ABNORMAL LOW (ref 35.8–46.3)
HGB: 7.1 g/dL — ABNORMAL LOW (ref 11.7–15.4)
IMMATURE GRANULOCYTES: 4 % (ref 0.0–5.0)
LYMPHOCYTES: 9 % — ABNORMAL LOW (ref 13–44)
MCH: 25.8 PG — ABNORMAL LOW (ref 26.1–32.9)
MCHC: 31.1 g/dL — ABNORMAL LOW (ref 31.4–35.0)
MCV: 82.9 FL (ref 79.6–97.8)
MONOCYTES: 9 % (ref 4.0–12.0)
MPV: 10.4 FL (ref 9.4–12.3)
NEUTROPHILS: 77 % (ref 43–78)
PLATELET: 264 10*3/uL (ref 150–450)
RBC: 2.75 M/uL — ABNORMAL LOW (ref 4.05–5.2)
RDW: 19.2 % — ABNORMAL HIGH (ref 11.9–14.6)
WBC: 6 10*3/uL (ref 4.3–11.1)

## 2019-02-08 LAB — METABOLIC PANEL, COMPREHENSIVE
A-G Ratio: 0.8 — ABNORMAL LOW (ref 1.2–3.5)
ALT (SGPT): 65 U/L (ref 12–65)
AST (SGOT): 131 U/L — ABNORMAL HIGH (ref 15–37)
Albumin: 2.4 g/dL — ABNORMAL LOW (ref 3.5–5.0)
Alk. phosphatase: 151 U/L — ABNORMAL HIGH (ref 50–130)
Anion gap: 10 mmol/L (ref 7–16)
BUN: 15 MG/DL (ref 6–23)
Bilirubin, total: 1.1 MG/DL (ref 0.2–1.1)
CO2: 22 mmol/L (ref 21–32)
Calcium: 8.9 MG/DL (ref 8.3–10.4)
Chloride: 110 mmol/L — ABNORMAL HIGH (ref 98–107)
Creatinine: 0.37 MG/DL — ABNORMAL LOW (ref 0.6–1.0)
GFR est AA: >60 mL/min/{1.73_m2} (ref >60)
GFR est non-AA: >60 mL/min/{1.73_m2} (ref >60)
Globulin: 3.2 g/dL (ref 2.3–3.5)
Glucose: 82 mg/dL (ref 65–100)
Potassium: 3.8 mmol/L (ref 3.5–5.1)
Protein, total: 5.6 g/dL — ABNORMAL LOW (ref 6.3–8.2)
Sodium: 142 mmol/L (ref 136–145)

## 2019-02-08 MED ORDER — SODIUM CHLORIDE 0.9% BOLUS IV
0.9 % | Freq: Once | INTRAVENOUS | Status: AC
Start: 2019-02-08 — End: 2019-02-08
  Administered 2019-02-08: 23:00:00 via INTRAVENOUS

## 2019-02-08 MED ORDER — IBUPROFEN 800 MG TAB
800 mg | ORAL | Status: AC
Start: 2019-02-08 — End: 2019-02-08
  Administered 2019-02-08: 23:00:00 via ORAL

## 2019-02-08 MED FILL — IBUPROFEN 800 MG TAB: 800 mg | ORAL | Qty: 1

## 2019-02-08 NOTE — Progress Notes (Signed)
Dc with keflex, wait final results

## 2019-02-08 NOTE — ED Provider Notes (Addendum)
Patient is status post recent C-section.  Reports onset of fevers and chills today.  States yesterday she did notice some dysuria and some foul-smelling urine.  Reports T-max today of 101.4.  Called EMS.  Was found to be febrile and tachycardic in route.  Reports no significant discharge or irritation to her C-section wound.  Denies any vaginal discharge.    The history is provided by the patient.   Fever   This is a new problem. The current episode started 3 to 5 hours ago. The problem occurs constantly. The maximum temperature noted was 101 - 101.9 F. Associated symptoms include headaches and urinary symptoms. Pertinent negatives include no chest pain, no vomiting, no congestion, no sore throat, no cough and no shortness of breath. She has tried nothing for the symptoms.        Past Medical History:   Diagnosis Date   ??? Asthma    ??? History of placental abruption    ??? Pre-eclampsia in postpartum period    ??? Preeclampsia    ??? Umbilical hernia        Past Surgical History:   Procedure Laterality Date   ??? HX TONSILLECTOMY           Family History:   Problem Relation Age of Onset   ??? Breast Cancer Paternal Aunt    ??? Ovarian Cancer Neg Hx    ??? Uterine Cancer Neg Hx    ??? Colon Cancer Neg Hx        Social History     Socioeconomic History   ??? Marital status: MARRIED     Spouse name: Not on file   ??? Number of children: Not on file   ??? Years of education: Not on file   ??? Highest education level: Not on file   Occupational History   ??? Not on file   Social Needs   ??? Financial resource strain: Not on file   ??? Food insecurity     Worry: Not on file     Inability: Not on file   ??? Transportation needs     Medical: Not on file     Non-medical: Not on file   Tobacco Use   ??? Smoking status: Never Smoker   ??? Smokeless tobacco: Never Used   Substance and Sexual Activity   ??? Alcohol use: Not Currently   ??? Drug use: Never   ??? Sexual activity: Yes   Lifestyle   ??? Physical activity     Days per week: Not on file      Minutes per session: Not on file   ??? Stress: Not on file   Relationships   ??? Social Product manager on phone: Not on file     Gets together: Not on file     Attends religious service: Not on file     Active member of club or organization: Not on file     Attends meetings of clubs or organizations: Not on file     Relationship status: Not on file   ??? Intimate partner violence     Fear of current or ex partner: Not on file     Emotionally abused: Not on file     Physically abused: Not on file     Forced sexual activity: Not on file   Other Topics Concern   ??? Military Service Not Asked   ??? Blood Transfusions Not Asked   ??? Caffeine Concern No   ???  Occupational Exposure Not Asked   ??? Hobby Hazards Not Asked   ??? Sleep Concern Not Asked   ??? Stress Concern Not Asked   ??? Weight Concern Not Asked   ??? Special Diet Not Asked   ??? Back Care Not Asked   ??? Exercise No   ??? Bike Helmet Not Asked   ??? Seat Belt Yes   ??? Self-Exams Yes   Social History Narrative    Abuse: Feels safe at home, no history of physical abuse, no history of sexual abuse         ALLERGIES: Patient has no known allergies.    Review of Systems   Constitutional: Positive for chills and fever.   HENT: Negative for congestion, facial swelling and sore throat.    Eyes: Negative for photophobia, redness and visual disturbance.   Respiratory: Negative for cough, chest tightness and shortness of breath.    Cardiovascular: Negative for chest pain.   Gastrointestinal: Negative for abdominal pain and vomiting.   Endocrine: Negative for polydipsia, polyphagia and polyuria.   Genitourinary: Positive for dysuria. Negative for frequency, vaginal discharge and vaginal pain.   Musculoskeletal: Negative for back pain, gait problem and neck stiffness.   Skin: Negative for color change and pallor.   Neurological: Positive for headaches. Negative for tremors, syncope and speech difficulty.   Hematological: Negative for adenopathy. Does not bruise/bleed easily.    Psychiatric/Behavioral: Negative for behavioral problems and confusion.   All other systems reviewed and are negative.      Vitals:    02/08/19 1701 02/08/19 1708   BP: (!) 159/94    Pulse: (!) 120    Resp: 20    Temp: (!) 100.7 ??F (38.2 ??C)    SpO2: 99% 99%   Weight: 81.6 kg (180 lb)    Height: 5' (1.524 m)             Physical Exam  Vitals signs and nursing note reviewed.   Constitutional:       Appearance: Normal appearance.   HENT:      Head: Normocephalic and atraumatic.      Nose: Nose normal. No congestion or rhinorrhea.      Mouth/Throat:      Mouth: Mucous membranes are moist.      Pharynx: No oropharyngeal exudate.   Eyes:      Extraocular Movements: Extraocular movements intact.      Conjunctiva/sclera: Conjunctivae normal.      Pupils: Pupils are equal, round, and reactive to light.   Neck:      Musculoskeletal: Normal range of motion and neck supple. No neck rigidity or muscular tenderness.   Cardiovascular:      Rate and Rhythm: Regular rhythm. Tachycardia present.      Pulses: Normal pulses.      Heart sounds: Normal heart sounds. No murmur. No friction rub. No gallop.    Pulmonary:      Effort: Pulmonary effort is normal. No respiratory distress.      Breath sounds: Normal breath sounds. No stridor.   Abdominal:      General: Abdomen is flat. Bowel sounds are normal.      Palpations: There is no mass.      Tenderness: There is no abdominal tenderness.       Musculoskeletal: Normal range of motion.         General: No swelling, tenderness, deformity or signs of injury.   Skin:     Capillary Refill: Capillary refill takes  less than 2 seconds.      Coloration: Skin is not jaundiced or pale.      Findings: No bruising or erythema.   Neurological:      General: No focal deficit present.      Mental Status: She is alert and oriented to person, place, and time.      Cranial Nerves: No cranial nerve deficit.      Sensory: No sensory deficit.      Motor: No weakness.          MDM   Number of Diagnoses or Management Options  Diagnosis management comments: Problem tachycardic here, does report urinary symptoms.  Plan to perform septic work-up.  Obtain urinalysis.  Treat with IV fluids and antipyretics    8:29 PM  Normal white blood cell count.  Hemoglobin is 7.1, previous was a, history of anemia.  Lactic acid.  Urinalysis is consistent with infection.  Clear chest x-ray.  Patient feels better after being treated with IV fluids, antipyretics and Rocephin.  Plan to discharge home on antibiotics.  Will send urine for culture.  She has a follow-up with her OB/GYN in the next 48 hours.         Amount and/or Complexity of Data Reviewed  Clinical lab tests: ordered and reviewed  Tests in the radiology section of CPT??: ordered and reviewed  Review and summarize past medical records: yes  Independent visualization of images, tracings, or specimens: yes    Risk of Complications, Morbidity, and/or Mortality  Presenting problems: high  Diagnostic procedures: moderate  Management options: moderate    Patient Progress  Patient progress: stable         Procedures          Results Include:    Recent Results (from the past 24 hour(s))   CBC WITH AUTOMATED DIFF    Collection Time: 02/08/19  6:14 PM   Result Value Ref Range    WBC 6.0 4.3 - 11.1 K/uL    RBC 2.75 (L) 4.05 - 5.2 M/uL    HGB 7.1 (L) 11.7 - 15.4 g/dL    HCT 22.8 (L) 35.8 - 46.3 %    MCV 82.9 79.6 - 97.8 FL    MCH 25.8 (L) 26.1 - 32.9 PG    MCHC 31.1 (L) 31.4 - 35.0 g/dL    RDW 19.2 (H) 11.9 - 14.6 %    PLATELET 264 150 - 450 K/uL    MPV 10.4 9.4 - 12.3 FL    ABSOLUTE NRBC 0.05 0.0 - 0.2 K/uL    DF AUTOMATED      NEUTROPHILS 77 43 - 78 %    LYMPHOCYTES 9 (L) 13 - 44 %    MONOCYTES 9 4.0 - 12.0 %    EOSINOPHILS 1 0.5 - 7.8 %    BASOPHILS 0 0.0 - 2.0 %    IMMATURE GRANULOCYTES 4 0.0 - 5.0 %    ABS. NEUTROPHILS 4.7 1.7 - 8.2 K/UL    ABS. LYMPHOCYTES 0.6 0.5 - 4.6 K/UL    ABS. MONOCYTES 0.5 0.1 - 1.3 K/UL    ABS. EOSINOPHILS 0.0 0.0 - 0.8 K/UL     ABS. BASOPHILS 0.0 0.0 - 0.2 K/UL    ABS. IMM. GRANS. 0.2 0.0 - 0.5 K/UL   METABOLIC PANEL, COMPREHENSIVE    Collection Time: 02/08/19  6:14 PM   Result Value Ref Range    Sodium 142 136 - 145 mmol/L    Potassium 3.8 3.5 - 5.1  mmol/L    Chloride 110 (H) 98 - 107 mmol/L    CO2 22 21 - 32 mmol/L    Anion gap 10 7 - 16 mmol/L    Glucose 82 65 - 100 mg/dL    BUN 15 6 - 23 MG/DL    Creatinine 0.37 (L) 0.6 - 1.0 MG/DL    GFR est AA >60 >60 ml/min/1.61m    GFR est non-AA >60 >60 ml/min/1.771m   Calcium 8.9 8.3 - 10.4 MG/DL    Bilirubin, total 1.1 0.2 - 1.1 MG/DL    ALT (SGPT) 65 12 - 65 U/L    AST (SGOT) 131 (H) 15 - 37 U/L    Alk. phosphatase 151 (H) 50 - 130 U/L    Protein, total 5.6 (L) 6.3 - 8.2 g/dL    Albumin 2.4 (L) 3.5 - 5.0 g/dL    Globulin 3.2 2.3 - 3.5 g/dL    A-G Ratio 0.8 (L) 1.2 - 3.5     LACTIC ACID    Collection Time: 02/08/19  6:14 PM   Result Value Ref Range    Lactic acid 0.8 0.4 - 2.0 MMOL/L   URINALYSIS W/ RFLX MICROSCOPIC    Collection Time: 02/08/19  7:23 PM   Result Value Ref Range    Color YELLOW      Appearance CLOUDY      Specific gravity 1.016 1.001 - 1.023      pH (UA) 7.0 5.0 - 9.0      Protein Negative NEG mg/dL    Glucose Negative mg/dL    Ketone Negative NEG mg/dL    Bilirubin Negative NEG      Blood TRACE (A) NEG      Urobilinogen 1.0 0.2 - 1.0 EU/dL    Nitrites Positive (A) NEG      Leukocyte Esterase SMALL (A) NEG       Voice dictation software was used during the making of this note.  This software is not perfect and grammatical and other typographical errors may be present.  This note has been proofread, but may still contain errors.  RyPhillips GroutMD; 02/08/2019 '@8'$ :30 PM   ===================================================================

## 2019-02-08 NOTE — ED Notes (Signed)
I have reviewed discharge instructions with the patient.  The patient verbalized understanding.    Patient left ED via Discharge Method: ambulatory to Home with herself.    Opportunity for questions and clarification provided.       Patient given 1 scripts.         To continue your aftercare when you leave the hospital, you may receive an automated call from our care team to check in on how you are doing.  This is a free service and part of our promise to provide the best care and service to meet your aftercare needs.??? If you have questions, or wish to unsubscribe from this service please call [phone].  Thank you for Choosing our [facility].

## 2019-02-08 NOTE — Progress Notes (Signed)
Urine is resistant, called and spoke with patient. She is admitted to [facility] now, she went there that night. She has just finished 2-3 days of IV abx. She is now on Augmentin and two other antibiotics. She is on 6th floor, women's speciality floor [id number] [facility]. Caitlyn is her nurse. She has an OB to follow with. [phone] nurses station, I have called [facility], spoke with Caitlyn, RN, she was given the culture results and will make her attending aware.

## 2019-02-08 NOTE — ED Triage Notes (Signed)
Pt masked prior to delivery. Pt had C Section 28DEC20. Pt c/o fever chills.

## 2019-02-08 NOTE — Progress Notes (Signed)
Urine is resistant, called and spoke with patient. She is admitted to [facility] now, she went there that night. She has just finished 2-3 days of IV abx. She is now on Augmentin and two other antibiotics. She is on 6th floor, women's speciality floor [phone] [facility]. Eligha Bridegroom is her nurse. She has an OB to follow with. [phone] nurses station, I have called [facility], spoke with Caitlyn, RN, she was given the culture results and will make her attending aware.

## 2019-02-08 NOTE — ED Provider Notes (Signed)
ED Provider Notes by Phillips Grout, MD at 02/08/19 1710                Author: Phillips Grout, MD  Service: --  Author Type: Physician       Filed: 02/08/19 2030  Date of Service: 02/08/19 1710  Status: Addendum          Editor: Phillips Grout, MD (Physician)          Related Notes: Original Note by Phillips Grout, MD (Physician) filed at 02/08/19 1712               Patient is status post recent C-section.  Reports onset of fevers and chills today.  States yesterday she did notice  some dysuria and some foul-smelling urine.  Reports T-max today of 101.4.  Called EMS.  Was found to be febrile and tachycardic in route.  Reports no significant discharge or irritation to her C-section wound.  Denies any vaginal discharge.      The history is provided by the patient.    Fever    This is a new problem. The  current episode started 3 to 5 hours ago. The problem  occurs constantly. The maximum temperature noted was 101 - 101.9 F. Associated symptoms include headaches and urinary symptoms. Pertinent negatives include no chest pain, no vomiting, no congestion, no sore throat, no cough  and no shortness of breath. She has tried nothing for the symptoms.             Past Medical History:        Diagnosis  Date         ?  Asthma       ?  History of placental abruption       ?  Pre-eclampsia in postpartum period       ?  Preeclampsia           ?  Umbilical hernia               Past Surgical History:         Procedure  Laterality  Date          ?  HX TONSILLECTOMY                   Family History:         Problem  Relation  Age of Onset          ?  Breast Cancer  Paternal Aunt       ?  Ovarian Cancer  Neg Hx       ?  Uterine Cancer  Neg Hx            ?  Colon Cancer  Neg Hx               Social History          Socioeconomic History         ?  Marital status:  MARRIED              Spouse name:  Not on file         ?  Number of children:  Not on file     ?  Years of education:  Not on file     ?  Highest education level:  Not  on file       Occupational History        ?  Not on file  Social Needs         ?  Financial resource strain:  Not on file        ?  Food insecurity              Worry:  Not on file         Inability:  Not on file        ?  Transportation needs              Medical:  Not on file         Non-medical:  Not on file       Tobacco Use         ?  Smoking status:  Never Smoker     ?  Smokeless tobacco:  Never Used       Substance and Sexual Activity         ?  Alcohol use:  Not Currently     ?  Drug use:  Never     ?  Sexual activity:  Yes       Lifestyle        ?  Physical activity              Days per week:  Not on file         Minutes per session:  Not on file         ?  Stress:  Not on file       Relationships        ?  Social Health visitor on phone:  Not on file         Gets together:  Not on file         Attends religious service:  Not on file         Active member of club or organization:  Not on file         Attends meetings of clubs or organizations:  Not on file         Relationship status:  Not on file        ?  Intimate partner violence              Fear of current or ex partner:  Not on file         Emotionally abused:  Not on file         Physically abused:  Not on file         Forced sexual activity:  Not on file        Other Topics  Concern         ?  Military Service  Not Asked     ?  Blood Transfusions  Not Asked     ?  Caffeine Concern  No     ?  Occupational Exposure  Not Asked     ?  Hobby Hazards  Not Asked     ?  Sleep Concern  Not Asked     ?  Stress Concern  Not Asked     ?  Weight Concern  Not Asked     ?  Special Diet  Not Asked     ?  Back Care  Not Asked     ?  Exercise  No     ?  Bike Helmet  Not Asked     ?  Seat Belt  Yes     ?  Self-Exams  Yes       Social History Narrative          Abuse: Feels safe at home, no history of physical abuse, no history of sexual abuse              ALLERGIES: Patient has no known allergies.      Review of Systems    Constitutional:  Positive for chills and fever .    HENT: Negative for congestion, facial swelling and sore throat.     Eyes: Negative for photophobia, redness and visual disturbance.    Respiratory: Negative for cough, chest tightness and shortness of breath.     Cardiovascular: Negative for chest pain.    Gastrointestinal: Negative for abdominal pain and vomiting.    Endocrine: Negative for polydipsia, polyphagia and polyuria.    Genitourinary: Positive for dysuria. Negative for frequency, vaginal discharge and vaginal pain.    Musculoskeletal: Negative for back pain, gait problem and neck stiffness.    Skin: Negative for color change and pallor.    Neurological: Positive for headaches. Negative for tremors, syncope and speech difficulty.    Hematological: Negative for adenopathy. Does not bruise/bleed easily.    Psychiatric/Behavioral: Negative for behavioral problems and confusion.    All other systems reviewed and are negative.           Vitals:           02/08/19 1701  02/08/19 1708         BP:  (!) 159/94       Pulse:  (!) 120       Resp:  20       Temp:  (!) 100.7 ??F (38.2 ??C)       SpO2:  99%  99%     Weight:  81.6 kg (180 lb)           Height:  5' (1.524 m)                  Physical Exam   Vitals signs and nursing note reviewed.   Constitutional:        Appearance: Normal appearance.   HENT :       Head: Normocephalic and atraumatic.      Nose: Nose normal. No congestion or rhinorrhea.      Mouth/Throat:      Mouth: Mucous membranes are moist.      Pharynx: No oropharyngeal exudate.   Eyes:       Extraocular Movements: Extraocular movements intact.      Conjunctiva/sclera: Conjunctivae normal.      Pupils: Pupils are equal, round, and reactive to light.   Neck:       Musculoskeletal: Normal range of motion and neck supple. No neck rigidity or muscular tenderness.    Cardiovascular:       Rate and Rhythm: Regular rhythm. Tachycardia present.      Pulses: Normal pulses.      Heart sounds: Normal heart sounds. No murmur.  No  friction rub. No gallop.    Pulmonary :       Effort: Pulmonary effort is normal. No respiratory distress.      Breath sounds: Normal breath sounds. No stridor.    Abdominal:      General: Abdomen is flat. Bowel sounds are normal.      Palpations: There is no mass.      Tenderness: There is no abdominal tenderness.  Musculoskeletal: Normal range of motion.          General: No swelling, tenderness, deformity or signs of injury.    Skin:      Capillary Refill: Capillary refill takes less than 2 seconds.      Coloration: Skin is not jaundiced or pale.      Findings: No bruising or erythema.   Neurological:       General: No focal deficit present.      Mental Status: She is alert and oriented to person, place, and time.      Cranial Nerves: No cranial nerve deficit.      Sensory: No sensory deficit.      Motor: No weakness.             MDM   Number of Diagnoses or Management Options   Diagnosis management comments: Problem tachycardic here, does report urinary symptoms.  Plan to perform septic work-up.  Obtain urinalysis.  Treat with IV fluids and antipyretics      8:29 PM   Normal white blood cell count.  Hemoglobin is 7.1, previous was a, history of anemia.   Lactic acid.  Urinalysis is consistent with infection.  Clear chest x-ray.  Patient feels better after being treated with IV fluids, antipyretics and Rocephin.  Plan to discharge home on antibiotics.  Will send urine for culture.  She has a follow-up  with her OB/GYN in the next 48 hours.             Amount and/or Complexity of Data Reviewed   Clinical lab tests: ordered and reviewed   Tests in the radiology section of CPT??: ordered and reviewed   Review and summarize past medical records: yes    Independent visualization of images, tracings, or specimens: yes      Risk of Complications, Morbidity, and/or Mortality   Presenting problems: high  Diagnostic procedures: moderate  Management options: moderate     Patient Progress   Patient progress: stable              Procedures               Results Include:        Recent Results (from the past 24 hour(s))     CBC WITH AUTOMATED DIFF          Collection Time: 02/08/19  6:14 PM         Result  Value  Ref Range            WBC  6.0  4.3 - 11.1 K/uL       RBC  2.75 (L)  4.05 - 5.2 M/uL       HGB  7.1 (L)  11.7 - 15.4 g/dL       HCT  22.8 (L)  35.8 - 46.3 %       MCV  82.9  79.6 - 97.8 FL       MCH  25.8 (L)  26.1 - 32.9 PG       MCHC  31.1 (L)  31.4 - 35.0 g/dL       RDW  19.2 (H)  11.9 - 14.6 %       PLATELET  264  150 - 450 K/uL       MPV  10.4  9.4 - 12.3 FL       ABSOLUTE NRBC  0.05  0.0 - 0.2 K/uL       DF  AUTOMATED  NEUTROPHILS  77  43 - 78 %       LYMPHOCYTES  9 (L)  13 - 44 %       MONOCYTES  9  4.0 - 12.0 %       EOSINOPHILS  1  0.5 - 7.8 %       BASOPHILS  0  0.0 - 2.0 %       IMMATURE GRANULOCYTES  4  0.0 - 5.0 %       ABS. NEUTROPHILS  4.7  1.7 - 8.2 K/UL       ABS. LYMPHOCYTES  0.6  0.5 - 4.6 K/UL       ABS. MONOCYTES  0.5  0.1 - 1.3 K/UL       ABS. EOSINOPHILS  0.0  0.0 - 0.8 K/UL       ABS. BASOPHILS  0.0  0.0 - 0.2 K/UL       ABS. IMM. GRANS.  0.2  0.0 - 0.5 K/UL       METABOLIC PANEL, COMPREHENSIVE          Collection Time: 02/08/19  6:14 PM         Result  Value  Ref Range            Sodium  142  136 - 145 mmol/L       Potassium  3.8  3.5 - 5.1 mmol/L       Chloride  110 (H)  98 - 107 mmol/L       CO2  22  21 - 32 mmol/L       Anion gap  10  7 - 16 mmol/L       Glucose  82  65 - 100 mg/dL       BUN  15  6 - 23 MG/DL       Creatinine  0.37 (L)  0.6 - 1.0 MG/DL       GFR est AA  >60  >60 ml/min/1.24m       GFR est non-AA  >60  >60 ml/min/1.718m      Calcium  8.9  8.3 - 10.4 MG/DL       Bilirubin, total  1.1  0.2 - 1.1 MG/DL       ALT (SGPT)  65  12 - 65 U/L       AST (SGOT)  131 (H)  15 - 37 U/L       Alk. phosphatase  151 (H)  50 - 130 U/L       Protein, total  5.6 (L)  6.3 - 8.2 g/dL       Albumin  2.4 (L)  3.5 - 5.0 g/dL       Globulin  3.2  2.3 - 3.5 g/dL       A-G Ratio  0.8 (L)  1.2 - 3.5          LACTIC ACID          Collection Time: 02/08/19  6:14 PM         Result  Value  Ref Range            Lactic acid  0.8  0.4 - 2.0 MMOL/L       URINALYSIS W/ RFLX MICROSCOPIC          Collection Time: 02/08/19  7:23 PM         Result  Value  Ref Range  Color  YELLOW          Appearance  CLOUDY          Specific gravity  1.016  1.001 - 1.023         pH (UA)  7.0  5.0 - 9.0         Protein  Negative  NEG mg/dL       Glucose  Negative  mg/dL       Ketone  Negative  NEG mg/dL       Bilirubin  Negative  NEG         Blood  TRACE (A)  NEG         Urobilinogen  1.0  0.2 - 1.0 EU/dL       Nitrites  Positive (A)  NEG              Leukocyte Esterase  SMALL (A)  NEG          Voice dictation software was used during the making of this note.  This software is not perfect and grammatical and other typographical errors may be present.  This note has been proofread, but may still contain errors.   Phillips Grout, MD; 02/08/2019  '@8' :30 PM    ===================================================================

## 2019-02-08 NOTE — ED Notes (Signed)
I have reviewed discharge instructions with the patient.  The patient verbalized understanding.    Patient left ED via Discharge Method: ambulatory to Home with herself.    Opportunity for questions and clarification provided.       Patient given 1 scripts.         To continue your aftercare when you leave the hospital, you may receive an automated call from our care team to check in on how you are doing.  This is a free service and part of our promise to provide the best care and service to meet your aftercare needs." If you have questions, or wish to unsubscribe from this service please call [phone].  Thank you for Choosing our [facility] Emergency Department.

## 2019-02-08 NOTE — ED Notes (Signed)
Pt masked prior to delivery. Pt had C Section [id number]. Pt c/o fever chills.

## 2019-02-09 LAB — PROCALCITONIN
Procalcitonin: 0.32 ng/mL
Procalcitonin: 0.32 ng/mL

## 2019-02-09 LAB — URINE MICROSCOPIC
Crystals, UA: 0 /LPF
Crystals, urine: 0 /LPF
MUCUS, URINE: 0 /lpf
Mucus: 0 /lpf

## 2019-02-09 LAB — URINALYSIS W/ RFLX MICROSCOPIC
Bilirubin, Urine: NEGATIVE
Bilirubin: NEGATIVE
Glucose, Ur: NEGATIVE mg/dL
Glucose: NEGATIVE mg/dL
Ketone: NEGATIVE mg/dL
Ketones, Urine: NEGATIVE mg/dL
Nitrite, Urine: POSITIVE — AB
Nitrites: POSITIVE — AB
Protein, UA: NEGATIVE mg/dL
Protein: NEGATIVE mg/dL
Specific Gravity, UA: 1.016 (ref 1.001–1.023)
Specific gravity: 1.016 (ref 1.001–1.023)
Urobilinogen, UA, POCT: 1 EU/dL (ref 0.2–1.0)
Urobilinogen: 1 EU/dL (ref 0.2–1.0)
pH (UA): 7 (ref 5.0–9.0)
pH, UA: 7 (ref 5.0–9.0)

## 2019-02-09 MED ORDER — TRIMETHOPRIM-SULFAMETHOXAZOLE 160 MG-800 MG TAB
160-800 mg | ORAL_TABLET | Freq: Two times a day (BID) | ORAL | 0 refills | Status: DC
Start: 2019-02-09 — End: 2019-02-08

## 2019-02-09 MED ORDER — SODIUM CHLORIDE 0.9 % IV PIGGY BACK
2 gram | INTRAVENOUS | Status: AC
Start: 2019-02-09 — End: 2019-02-08
  Administered 2019-02-09: 01:00:00 via INTRAVENOUS

## 2019-02-09 MED ORDER — CEPHALEXIN 500 MG CAP
500 mg | ORAL_CAPSULE | Freq: Two times a day (BID) | ORAL | 0 refills | Status: AC
Start: 2019-02-09 — End: 2019-02-15

## 2019-02-09 MED FILL — CEFTRIAXONE 2 GRAM SOLUTION FOR INJECTION: 2 gram | INTRAMUSCULAR | Qty: 2

## 2019-02-11 LAB — CULTURE, URINE
Culture result:: <10000
Culture result:: >100000 — AB
Culture: <10000

## 2019-02-13 LAB — CULTURE, BLOOD 1
Culture: NO GROWTH
Culture: NO GROWTH

## 2019-02-13 LAB — CULTURE, BLOOD
Culture result:: NO GROWTH
Culture result:: NO GROWTH

## 2019-02-14 ENCOUNTER — Encounter: Payer: BLUE CROSS/BLUE SHIELD | Attending: Women's Health

## 2019-03-18 ENCOUNTER — Encounter: Payer: MEDICAID | Attending: Family Medicine

## 2019-03-25 ENCOUNTER — Ambulatory Visit: Attending: Obstetrics & Gynecology

## 2019-03-25 ENCOUNTER — Ambulatory Visit: Admit: 2019-03-25 | Discharge: 2019-03-25 | Payer: MEDICAID | Attending: Obstetrics & Gynecology

## 2019-03-25 NOTE — Patient Instructions (Signed)
We will call you if anything is abnormal from your testing today.  Follow up as needed or next year for your yearly female exam.  Thanks for coming to see us today and letting us take care of you!

## 2019-03-25 NOTE — Assessment & Plan Note (Signed)
Pt on lovenox - mgmt per PCP

## 2019-03-25 NOTE — Assessment & Plan Note (Signed)
Pt has appt with Gen Surg tomorrow for surgical repair coinciding with BTL

## 2019-03-25 NOTE — Progress Notes (Signed)
Patient is here today for PPV.     Delivery Method: C-Section    Delivery Date: 02/03/2019    Birth Control: none    Breast/Bottle: Formula    Bleeding: no    Baby: Doing well    Bowel/Bladder: No issues    Blues: None    Last pap smear:  unsure    Visit Vitals  BP 124/80 (BP 1 Location: Left arm, BP Patient Position: Sitting)   Temp 97.9 ??F (36.6 ??C) (Temporal)   Ht 5' (1.524 m)   Wt 169 lb (76.7 kg)   Breastfeeding No   BMI 33.01 kg/m??        Keishon Chavarin E Cara Aguino, CMA  10:14 AM  03/25/19

## 2019-03-25 NOTE — Progress Notes (Signed)
POSTPARTUM NOTE    Debra PERCLE is a 29 y.o. G6 [id number] that presents for PP visit today    Birth Control: none    Breast/Bottle: Formula    Bleeding: none    Baby: Doing well    Bowel/Bladder: No issues    Blues: None    Last pap:  Unsure    Patient was admitted to [facility] for postpartum endometritis with suspected infected hematoma and acute cystitis.  Patient was treated and released and subsequently returned and diagnosed with pulmonary embolus - Care Everywhere reviewed and confirmed with pt    OB History   Gravida Para Term Preterm AB Living   6 4 3 1 2 4    SAB TAB Ectopic Molar Multiple Live Births           0 4      # Outcome Date GA Lbr Len/2nd Weight Sex Delivery Anes PTL Lv   6 Term 02/03/19 [redacted]w[redacted]d   F CS-LTranv Gen N LIV      Complications: Other (comment)   5 Term 2017 [redacted]w[redacted]d  7 lb (3.175 kg)  Vag-Spont  N LIV      Complications: Preeclampsia in postpartum period   4 Term 2015 [redacted]w[redacted]d  6 lb (2.722 kg)  Vag-Spont  N LIV      Complications: Abruptio Placenta   3 Preterm 2012 [redacted]w[redacted]d  5 lb 9 oz (2.523 kg) M Vag-Spont  N LIV      Complications: Preeclampsia   2 AB            1 AB                 Past Medical History:   Diagnosis Date   ??? Asthma    ??? History of placental abruption    ??? Pre-eclampsia in postpartum period    ??? Preeclampsia    ??? Umbilical hernia        Past Surgical History:   Procedure Laterality Date   ??? HX TONSILLECTOMY          Social History     Socioeconomic History   ??? Marital status: MARRIED     Spouse name: Not on file   ??? Number of children: Not on file   ??? Years of education: Not on file   ??? Highest education level: Not on file   Occupational History   ??? Not on file   Social Needs   ??? Financial resource strain: Not on file   ??? Food insecurity     Worry: Not on file     Inability: Not on file   ??? Transportation needs     Medical: Not on file     Non-medical: Not on file   Tobacco Use   ??? Smoking status: Never Smoker   ??? Smokeless tobacco: Never Used   Substance and Sexual Activity    ??? Alcohol use: Not Currently   ??? Drug use: Never   ??? Sexual activity: Yes   Lifestyle   ??? Physical activity     Days per week: Not on file     Minutes per session: Not on file   ??? Stress: Not on file   Relationships   ??? Social M on phone: Not on file     Gets together: Not on file     Attends religious service: Not on file     Active member of club or organization: Not on file  Attends meetings of clubs or organizations: Not on file     Relationship status: Not on file   ??? Intimate partner violence     Fear of current or ex partner: Not on file     Emotionally abused: Not on file     Physically abused: Not on file     Forced sexual activity: Not on file   Other Topics Concern   ??? Military Service Not Asked   ??? Blood Transfusions Not Asked   ??? Caffeine Concern No   ??? Occupational Exposure Not Asked   ??? Hobby Hazards Not Asked   ??? Sleep Concern Not Asked   ??? Stress Concern Not Asked   ??? Weight Concern Not Asked   ??? Special Diet Not Asked   ??? Back Care Not Asked   ??? Exercise No   ??? Bike Helmet Not Asked   ??? Seat Belt Yes   ??? Self-Exams Yes   Social History Narrative    Abuse: Feels safe at home, no history of physical abuse, no history of sexual abuse       Visit Vitals  BP 124/80 (BP 1 Location: Left arm, BP Patient Position: Sitting)   Temp 97.9 ??F (36.6 ??C) (Temporal)   Ht 5' (1.524 m)   Wt 169 lb (76.7 kg)   Breastfeeding No   BMI 33.01 kg/m??        Review of Systems:     Constitutional:  No fevers or chills    CV: No chest pain or palpitations    Resp: No SOB or cough    GI:  No nausea/vomiting/diarrhea/constipation    Neuro: No HA, no seizure like activity    Skin: No rashes or lesions    Breast: No breast pain, masses, bleeding    GU: No dysuria or hematuria    Gyn:  No menstrual, pelvic issues      Physical Exam:     General:  well developed, well nourished, in no acute distress     Head:   normocephalic and atraumatic     Mouth:  no deformity or lesions with good dentition      Neck:  no masses, thyromegaly, or abnormal cervical nodes     Lungs:  clear bilaterally with normal respirations     Heart:   regular rate and rhythm, S1, S2 without murmurs     Abdomen:  bowel sounds positive; abdomen soft and non-tender without masses, organomegaly, or hernias noted     Pelvic Exam:       External: normal female genitalia without lesions or masses       Vagina: normal without lesions or masses       Cervix: normal without lesions or masses       Adnexa: normal bimanual exam without masses or fullness       Uterus: normal by palpation       Pap smear:  performed     Msk:   no deformity or scoliosis noted with normal posture and gait     Extremities: no clubbing, cyanosis, edema, or deformity noted with normal full range of motion of all joints     Neurologic:  no focal deficits,normal coordination, muscle strength and tone     Skin:   intact without lesions or rashes     Psych:  alert and cooperative; normal mood and affect; normal attention span and concentration    Problem List  Date Reviewed: 03/25/2019  Codes Class    Postpartum exam ICD-10-CM: Z39.2  ICD-9-CM: V24.2     Overview Signed 03/25/2019 10:37 AM by Aldean Baker, MD     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             Single subsegmental pulmonary embolism without acute cor pulmonale (HCC) ICD-10-CM: I26.93  ICD-9-CM: 415.19     Overview Signed 03/25/2019 10:42 AM by Aldean Baker, MD     At [facility] on 03/08/19             Umbilical hernia with obstruction, without gangrene ICD-10-CM: K42.0  ICD-9-CM: 552.1     Overview Signed 03/25/2019 10:45 AM by Aldean Baker, MD     noted                  Problem List Items Addressed This Visit        Circulatory    Single subsegmental pulmonary embolism without acute cor pulmonale (HCC)     Pt on lovenox - mgmt per PCP         Relevant Medications    enoxaparin (LOVENOX) 80 mg/0.8 mL injection    Lovenox 80 mg/0.8 mL injection       Other     Postpartum exam - Primary    Relevant Orders    PAP IG, CT-NG-TV, RFX APTIMA HPV ASCUS ([id number])    Umbilical hernia with obstruction, without gangrene     Pt has appt with Gen Surg tomorrow for surgical repair coinciding with BTL                Aldean Baker, MD     10:46 AM    03/25/19

## 2019-03-25 NOTE — Assessment & Plan Note (Signed)
Pt has appt with Gen Surg tomorrow for surgical repair coinciding with BTL

## 2019-03-25 NOTE — Progress Notes (Signed)
 Patient is here today for PPV.     Delivery Method: C-Section    Delivery Date: 02/03/2019    Birth Control: none    Breast/Bottle: Formula    Bleeding: no    Baby: Doing well    Bowel/Bladder: No issues    Blues: None    Last pap smear:  unsure    Visit Vitals  BP 124/80 (BP 1 Location: Left arm, BP Patient Position: Sitting)   Temp 97.9 F (36.6 C) (Temporal)   Ht 5' (1.524 m)   Wt 169 lb (76.7 kg)   Breastfeeding No   BMI 33.01 kg/m        Lucie FORBES Lukes, CMA  10:14 AM  03/25/19

## 2019-03-25 NOTE — Progress Notes (Signed)
POSTPARTUM NOTE    Debra Warren is a 29 y.o. G6 [phone] that presents for PP visit today    Birth Control: none    Breast/Bottle: Formula    Bleeding: none    Baby: Doing well    Bowel/Bladder: No issues    Blues: None    Last pap:  Unsure    Patient was admitted to [facility] for postpartum endometritis with suspected infected hematoma and acute cystitis.  Patient was treated and released and subsequently returned and diagnosed with pulmonary embolus - Care Everywhere reviewed and confirmed with pt    OB History   Gravida Para Term Preterm AB Living   6 4 3 1 2 4    SAB TAB Ectopic Molar Multiple Live Births           0 4      # Outcome Date GA Lbr Len/2nd Weight Sex Delivery Anes PTL Lv   6 Term 02/03/19 [redacted]w[redacted]d   F CS-LTranv Gen N LIV      Complications: Other (comment)   5 Term 2017 [redacted]w[redacted]d  7 lb (3.175 kg)  Vag-Spont  N LIV      Complications: Preeclampsia in postpartum period   4 Term 2015 [redacted]w[redacted]d  6 lb (2.722 kg)  Vag-Spont  N LIV      Complications: Abruptio Placenta   3 Preterm 2012 [redacted]w[redacted]d  5 lb 9 oz (2.523 kg) M Vag-Spont  N LIV      Complications: Preeclampsia   2 AB            1 AB                 Past Medical History:   Diagnosis Date   ??? Asthma    ??? History of placental abruption    ??? Pre-eclampsia in postpartum period    ??? Preeclampsia    ??? Umbilical hernia        Past Surgical History:   Procedure Laterality Date   ??? HX TONSILLECTOMY          Social History     Socioeconomic History   ??? Marital status: MARRIED     Spouse name: Not on file   ??? Number of children: Not on file   ??? Years of education: Not on file   ??? Highest education level: Not on file   Occupational History   ??? Not on file   Social Needs   ??? Financial resource strain: Not on file   ??? Food insecurity     Worry: Not on file     Inability: Not on file   ??? Transportation needs     Medical: Not on file     Non-medical: Not on file   Tobacco Use   ??? Smoking status: Never Smoker   ??? Smokeless tobacco: Never Used   Substance and Sexual Activity   ??? Alcohol  use: Not Currently   ??? Drug use: Never   ??? Sexual activity: Yes   Lifestyle   ??? Physical activity     Days per week: Not on file     Minutes per session: Not on file   ??? Stress: Not on file   Relationships   ??? Social Product manager on phone: Not on file     Gets together: Not on file     Attends religious service: Not on file     Active member of club or organization: Not on file  Attends meetings of clubs or organizations: Not on file     Relationship status: Not on file   ??? Intimate partner violence     Fear of current or ex partner: Not on file     Emotionally abused: Not on file     Physically abused: Not on file     Forced sexual activity: Not on file   Other Topics Concern   ??? Military Service Not Asked   ??? Blood Transfusions Not Asked   ??? Caffeine Concern No   ??? Occupational Exposure Not Asked   ??? Hobby Hazards Not Asked   ??? Sleep Concern Not Asked   ??? Stress Concern Not Asked   ??? Weight Concern Not Asked   ??? Special Diet Not Asked   ??? Back Care Not Asked   ??? Exercise No   ??? Bike Helmet Not Asked   ??? Seat Belt Yes   ??? Self-Exams Yes   Social History Narrative    Abuse: Feels safe at home, no history of physical abuse, no history of sexual abuse       Visit Vitals  BP 124/80 (BP 1 Location: Left arm, BP Patient Position: Sitting)   Temp 97.9 ??F (36.6 ??C) (Temporal)   Ht 5' (1.524 m)   Wt 169 lb (76.7 kg)   Breastfeeding No   BMI 33.01 kg/m??        Review of Systems:     Constitutional:  No fevers or chills    CV: No chest pain or palpitations    Resp: No SOB or cough    GI:  No nausea/vomiting/diarrhea/constipation    Neuro: No HA, no seizure like activity    Skin: No rashes or lesions    Breast: No breast pain, masses, bleeding    GU: No dysuria or hematuria    Gyn:  No menstrual, pelvic issues      Physical Exam:     General:  well developed, well nourished, in no acute distress     Head:   normocephalic and atraumatic     Mouth:  no deformity or lesions with good dentition     Neck:  no masses,  thyromegaly, or abnormal cervical nodes     Lungs:  clear bilaterally with normal respirations     Heart:   regular rate and rhythm, S1, S2 without murmurs     Abdomen:  bowel sounds positive; abdomen soft and non-tender without masses, organomegaly, or hernias noted     Pelvic Exam:       External: normal female genitalia without lesions or masses       Vagina: normal without lesions or masses       Cervix: normal without lesions or masses       Adnexa: normal bimanual exam without masses or fullness       Uterus: normal by palpation       Pap smear:  performed     Msk:   no deformity or scoliosis noted with normal posture and gait     Extremities: no clubbing, cyanosis, edema, or deformity noted with normal full range of motion of all joints     Neurologic:  no focal deficits,normal coordination, muscle strength and tone     Skin:   intact without lesions or rashes     Psych:  alert and cooperative; normal mood and affect; normal attention span and concentration    Problem List  Date Reviewed: 03/25/2019  Codes Class    Postpartum exam ICD-10-CM: Z39.2  ICD-9-CM: V24.2     Overview Signed 03/25/2019 10:37 AM by Angela Burke, MD     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             Single subsegmental pulmonary embolism without acute cor pulmonale (HCC) ICD-10-CM: I26.93  ICD-9-CM: 415.19     Overview Signed 03/25/2019 10:42 AM by Angela Burke, MD     At [facility] on 03/08/19             Umbilical hernia with obstruction, without gangrene ICD-10-CM: K42.0  ICD-9-CM: 552.1     Overview Signed 03/25/2019 10:45 AM by Angela Burke, MD     noted                  Problem List Items Addressed This Visit        Circulatory    Single subsegmental pulmonary embolism without acute cor pulmonale (HCC)     Pt on lovenox - mgmt per PCP         Relevant Medications    enoxaparin (LOVENOX) 80 mg/0.8 mL injection    Lovenox 80 mg/0.8 mL injection       Other    Postpartum exam - Primary    Relevant  Orders    PAP IG, CT-NG-TV, RFX APTIMA HPV ASCUS ([id number])    Umbilical hernia with obstruction, without gangrene     Pt has appt with Gen Surg tomorrow for surgical repair coinciding with BTL                Angela Burke, MD     10:46 AM    03/25/19

## 2019-03-25 NOTE — Assessment & Plan Note (Signed)
Pt on lovenox - mgmt per PCP

## 2019-04-02 ENCOUNTER — Emergency Department: Admit: 2019-04-02 | Payer: MEDICAID

## 2019-04-02 ENCOUNTER — Inpatient Hospital Stay
Admit: 2019-04-02 | Discharge: 2019-04-02 | Disposition: A | Discharge status: Home / Self Care | Payer: MEDICAID | Attending: Emergency Medicine

## 2019-04-02 DIAGNOSIS — M79652 Pain in left thigh: Secondary | ICD-10-CM

## 2019-04-02 LAB — HCG URINE, QL. - POC
HCG, Pregnancy, Urine, POC: NEGATIVE
Pregnancy test,urine (POC): NEGATIVE

## 2019-04-02 NOTE — ED Notes (Signed)

## 2019-04-02 NOTE — Progress Notes (Signed)
SW met with patient who reports difficulty obtaining her Eliquis following admission to [facility]. Patient was uninsured and states it was too expensive. SW met with the patient who states that she has Medicaid now. SW provided the patient with a 30 day free trial offer should she had any difficulty with her insurance.     Tina LeAnn Chastain, LMSW  Social Work Case Manager  [facility]    * [email or web address]

## 2019-04-02 NOTE — ED Triage Notes (Signed)
Pt to ED with c/o left upper leg pain that radiates down her leg. Pt states hx of PE in lungs and was treated at [facility] and discharged on the first of feb. Pt states left leg pain started about 4 days ago and her thigh is swollen. Pt presents with halter monitor on, denies shortness of breath or chest pain.     Pt masked PTA.

## 2019-04-02 NOTE — ED Provider Notes (Signed)
Patient is here with left leg pain and swelling that started about 3 days ago.  She states she recently was admitted to [facility] with pulmonary embolus and had been unable to fill the Eliquis that they had prescribed as she had lost her health insurance.  They recently got approved for Medicaid so she is filling the Eliquis today.  She did call them about a week ago and they were able to give her 7 days of Lovenox.  She has used it intermittently but did use it last night.  She is not having any chest pain or shortness of breath.  She also had a urinary tract and uterine infection following her C-section which was 2 months ago.  This was her fourth pregnancy.  It was an emergency C-section.  She is not having any headache, visual changes, nausea, vomiting, chest pain, shortness of breath, abdominal pain, dizziness, weakness, dyspnea on exertion, orthopnea or other new symptoms.  No trouble with urination or bowel movements.    The history is provided by the patient.   Leg Pain   This is a new problem. The current episode started more than 2 days ago. The problem occurs constantly. The problem has been gradually worsening. The pain is present in the left upper leg. The quality of the pain is described as aching. The pain is at a severity of 9/10. The pain is moderate. Pertinent negatives include no numbness, full range of motion, no stiffness, no tingling, no itching, no back pain and no neck pain. There has been no history of extremity trauma.        Past Medical History:   Diagnosis Date   ??? Asthma    ??? History of placental abruption    ??? Pre-eclampsia in postpartum period    ??? Preeclampsia    ??? Umbilical hernia        Past Surgical History:   Procedure Laterality Date   ??? HX TONSILLECTOMY           Family History:   Problem Relation Age of Onset   ??? Breast Cancer Paternal Aunt    ??? Ovarian Cancer Neg Hx    ??? Uterine Cancer Neg Hx    ??? Colon Cancer Neg Hx        Social History     Socioeconomic History    ??? Marital status: MARRIED     Spouse name: Not on file   ??? Number of children: Not on file   ??? Years of education: Not on file   ??? Highest education level: Not on file   Occupational History   ??? Not on file   Social Needs   ??? Financial resource strain: Not on file   ??? Food insecurity     Worry: Not on file     Inability: Not on file   ??? Transportation needs     Medical: Not on file     Non-medical: Not on file   Tobacco Use   ??? Smoking status: Never Smoker   ??? Smokeless tobacco: Never Used   Substance and Sexual Activity   ??? Alcohol use: Not Currently   ??? Drug use: Never   ??? Sexual activity: Yes   Lifestyle   ??? Physical activity     Days per week: Not on file     Minutes per session: Not on file   ??? Stress: Not on file   Relationships   ??? Social Product manager on  phone: Not on file     Gets together: Not on file     Attends religious service: Not on file     Active member of club or organization: Not on file     Attends meetings of clubs or organizations: Not on file     Relationship status: Not on file   ??? Intimate partner violence     Fear of current or ex partner: Not on file     Emotionally abused: Not on file     Physically abused: Not on file     Forced sexual activity: Not on file   Other Topics Concern   ??? Military Service Not Asked   ??? Blood Transfusions Not Asked   ??? Caffeine Concern No   ??? Occupational Exposure Not Asked   ??? Hobby Hazards Not Asked   ??? Sleep Concern Not Asked   ??? Stress Concern Not Asked   ??? Weight Concern Not Asked   ??? Special Diet Not Asked   ??? Back Care Not Asked   ??? Exercise No   ??? Bike Helmet Not Asked   ??? Seat Belt Yes   ??? Self-Exams Yes   Social History Narrative    Abuse: Feels safe at home, no history of physical abuse, no history of sexual abuse         ALLERGIES: Patient has no known allergies.    Review of Systems   Constitutional: Negative.    HENT: Negative.    Eyes: Negative.    Respiratory: Negative.    Cardiovascular: Negative.    Gastrointestinal: Negative.     Genitourinary: Negative.    Musculoskeletal: Negative.  Negative for back pain, neck pain and stiffness.        Leg pain and swelling   Skin: Negative.  Negative for itching.   Neurological: Negative.  Negative for tingling and numbness.   Psychiatric/Behavioral: Negative.    All other systems reviewed and are negative.      Vitals:    04/02/19 1550   BP: (!) 150/94   Pulse: 70   Resp: 20   Temp: 99.6 ??F (37.6 ??C)   SpO2: 100%   Weight: 77.1 kg (170 lb)   Height: 5' (1.524 m)            Physical Exam  Vitals signs and nursing note reviewed.   Constitutional:       Appearance: She is well-developed.   HENT:      Head: Normocephalic and atraumatic.      Right Ear: External ear normal.      Left Ear: External ear normal.      Nose: Nose normal.   Eyes:      Conjunctiva/sclera: Conjunctivae normal.      Pupils: Pupils are equal, round, and reactive to light.   Neck:      Musculoskeletal: Normal range of motion and neck supple.   Cardiovascular:      Rate and Rhythm: Normal rate and regular rhythm.      Heart sounds: Normal heart sounds.   Pulmonary:      Effort: Pulmonary effort is normal.      Breath sounds: Normal breath sounds.   Abdominal:      General: Bowel sounds are normal.      Palpations: Abdomen is soft.   Musculoskeletal: Normal range of motion.         General: Tenderness present. No signs of injury.      Left lower leg:  Edema present.   Skin:     General: Skin is warm and dry.      Capillary Refill: Capillary refill takes less than 2 seconds.   Neurological:      General: No focal deficit present.      Mental Status: She is alert and oriented to person, place, and time. Mental status is at baseline.      Deep Tendon Reflexes: Reflexes are normal and symmetric.   Psychiatric:         Mood and Affect: Mood normal.         Behavior: Behavior normal.         Thought Content: Thought content normal.         Judgment: Judgment normal.          MDM  Number of Diagnoses or Management Options      Amount and/or Complexity of Data Reviewed  Review and summarize past medical records: yes    Risk of Complications, Morbidity, and/or Mortality  Presenting problems: moderate  Diagnostic procedures: moderate  Management options: moderate           Procedures      The patient was observed in the ED.    Results Reviewed:      Recent Results (from the past 24 hour(s))   HCG URINE, QL. - POC    Collection Time: 04/02/19  4:14 PM   Result Value Ref Range    Pregnancy test,urine (POC) Negative NEG       DUPLEX LOWER EXT VENOUS LEFT   Final Result   1. No evidence for left lower extremity DVT.         Patient has no redness or warmth to the skin in this area.  She states she has not been exercising or doing anything that could have injured it.  She has been resting.  She does appear to have some tight muscles today and I think this is the cause of her pain.  She was encouraged to stretch, use warm moist heat and massage.  She is going to start the Eliquis today.  Inetta Fermo, case manager has given her a slip to fill it and she also states her Medicaid should start today so she can fill it.  I did make a referral to a primary care physician for recheck.  The urine was negative for infection.  She states she has not had any fevers.  She is stable for discharge and ambulatory out of the ER without difficulty and will return if worsening in any way.  I discussed the results of all labs, procedures, radiographs, and treatments with the patient and available family.  Treatment plan is agreed upon and the patient is ready for discharge.  All voiced understanding of the discharge plan and medication instructions or changes as appropriate.  Questions about treatment in the ED were answered.  All were encouraged to return should symptoms worsen or new problems develop.

## 2019-04-02 NOTE — ED Provider Notes (Signed)
ED Provider Notes by Joanie Coddington, PA at 04/02/19 1606                Author: Joanie Coddington, PA  Service: Emergency Medicine  Author Type: Physician Assistant       Filed: 04/02/19 1800  Date of Service: 04/02/19 1606  Status: Attested           Editor: Leonor Liv Charm Barges, PA (Physician Assistant)  Cosigner: Standley Brooking, MD at 04/02/19 2041          Attestation signed by Standley Brooking, MD at 04/02/19 2041          I was personally available for consultation in the emergency department.  The patient was seen and cared for primarily by the midlevel provider. Standley Brooking,  MD                                  Patient is here with left leg pain and swelling that started about 3 days ago.  She states she recently was admitted  to [facility] with pulmonary embolus and had been unable to fill the Eliquis that they had prescribed as she had lost her health insurance.  They recently got approved for Medicaid so she is filling the Eliquis today.  She did call them about a week ago and  they were able to give her 7 days of Lovenox.  She has used it intermittently but did use it last night.  She is not having any chest pain or shortness of breath.  She also had a urinary tract and uterine infection following her C-section which was 2  months ago.  This was her fourth pregnancy.  It was an emergency C-section.  She is not having any headache, visual changes, nausea, vomiting, chest pain, shortness of breath, abdominal pain, dizziness, weakness, dyspnea on exertion, orthopnea or other  new symptoms.  No trouble with urination or bowel movements.      The history is provided by the patient.    Leg Pain    This is a new problem. The  current episode started more than 2 days ago. The problem  occurs constantly. The problem has been gradually worsening.  The pain is present in the left upper leg. The quality of the pain is described as aching. The pain is at a severity of 9/10. The pain is  moderate. Pertinent negatives include no numbness, full range of motion, no stiffness, no tingling, no itching, no back pain and no neck pain. There  has been no history of extremity trauma.             Past Medical History:        Diagnosis  Date         ?  Asthma       ?  History of placental abruption       ?  Pre-eclampsia in postpartum period       ?  Preeclampsia           ?  Umbilical hernia               Past Surgical History:         Procedure  Laterality  Date          ?  HX TONSILLECTOMY  Family History:         Problem  Relation  Age of Onset          ?  Breast Cancer  Paternal Aunt       ?  Ovarian Cancer  Neg Hx       ?  Uterine Cancer  Neg Hx            ?  Colon Cancer  Neg Hx               Social History          Socioeconomic History         ?  Marital status:  MARRIED              Spouse name:  Not on file         ?  Number of children:  Not on file     ?  Years of education:  Not on file     ?  Highest education level:  Not on file       Occupational History        ?  Not on file       Social Needs         ?  Financial resource strain:  Not on file        ?  Food insecurity              Worry:  Not on file         Inability:  Not on file        ?  Transportation needs              Medical:  Not on file         Non-medical:  Not on file       Tobacco Use         ?  Smoking status:  Never Smoker     ?  Smokeless tobacco:  Never Used       Substance and Sexual Activity         ?  Alcohol use:  Not Currently     ?  Drug use:  Never     ?  Sexual activity:  Yes       Lifestyle        ?  Physical activity              Days per week:  Not on file         Minutes per session:  Not on file         ?  Stress:  Not on file       Relationships        ?  Social Engineer, manufacturing systems on phone:  Not on file         Gets together:  Not on file         Attends religious service:  Not on file         Active member of club or organization:  Not on file         Attends meetings of clubs or  organizations:  Not on file         Relationship status:  Not on file        ?  Intimate partner violence              Fear  of current or ex partner:  Not on file         Emotionally abused:  Not on file         Physically abused:  Not on file         Forced sexual activity:  Not on file        Other Topics  Concern         ?  Military Service  Not Asked     ?  Blood Transfusions  Not Asked     ?  Caffeine Concern  No     ?  Occupational Exposure  Not Asked     ?  Hobby Hazards  Not Asked     ?  Sleep Concern  Not Asked     ?  Stress Concern  Not Asked     ?  Weight Concern  Not Asked     ?  Special Diet  Not Asked     ?  Back Care  Not Asked     ?  Exercise  No     ?  Bike Helmet  Not Asked     ?  Seat Belt  Yes     ?  Self-Exams  Yes       Social History Narrative          Abuse: Feels safe at home, no history of physical abuse, no history of sexual abuse              ALLERGIES: Patient has no known allergies.      Review of Systems    Constitutional: Negative.     HENT: Negative.     Eyes: Negative.     Respiratory: Negative.     Cardiovascular: Negative.     Gastrointestinal: Negative.     Genitourinary: Negative.     Musculoskeletal: Negative.  Negative for back pain, neck pain and stiffness.         Leg pain and swelling    Skin: Negative.  Negative for itching.    Neurological: Negative.  Negative for tingling and numbness.    Psychiatric/Behavioral: Negative.     All other systems reviewed and are negative.           Vitals:          04/02/19 1550        BP:  (!) 150/94     Pulse:  70     Resp:  20     Temp:  99.6 ??F (37.6 ??C)     SpO2:  100%     Weight:  77.1 kg (170 lb)        Height:  5' (1.524 m)                Physical Exam   Vitals signs and nursing note reviewed.   Constitutional:        Appearance: She is well-developed.   HENT :       Head: Normocephalic and atraumatic.      Right Ear: External ear normal.      Left Ear: External ear normal.      Nose: Nose normal.   Eyes:       Conjunctiva/sclera:  Conjunctivae normal.      Pupils: Pupils are equal, round, and reactive to light.    Neck:       Musculoskeletal: Normal range of motion and neck supple.   Cardiovascular :  Rate and Rhythm: Normal rate and regular rhythm.      Heart sounds: Normal heart sounds.    Pulmonary:       Effort: Pulmonary effort is normal.      Breath sounds: Normal breath sounds.   Abdominal :      General: Bowel sounds are normal.      Palpations: Abdomen is soft.     Musculoskeletal: Normal range of motion.          General: Tenderness present. No signs of injury.      Left lower leg: Edema  present.    Skin:      General: Skin is warm and dry.      Capillary Refill: Capillary refill takes less than 2 seconds.    Neurological:       General: No focal deficit present.      Mental Status: She is alert and oriented to person, place, and time. Mental status is at baseline.      Deep Tendon Reflexes: Reflexes are normal and symmetric.   Psychiatric:         Mood and Affect: Mood normal.          Behavior: Behavior normal.         Thought Content: Thought content normal.         Judgment: Judgment normal.              MDM   Number of Diagnoses or Management Options       Amount and/or Complexity of Data Reviewed   Review  and summarize past medical records: yes      Risk of Complications, Morbidity, and/or Mortality   Presenting problems: moderate  Diagnostic procedures: moderate  Management options: moderate               Procedures         The patient was observed in the ED.      Results Reviewed:           Recent Results (from the past 24 hour(s))     HCG URINE, QL. - POC          Collection Time: 04/02/19  4:14 PM         Result  Value  Ref Range            Pregnancy test,urine (POC)  Negative  NEG            DUPLEX LOWER EXT VENOUS LEFT       Final Result     1. No evidence for left lower extremity DVT.                Patient has no redness or warmth to the skin in this area.  She states she has not been exercising or doing  anything that could have injured it.  She has been resting.  She does appear to have some tight muscles today and I think this is the cause of  her pain.  She was encouraged to stretch, use warm moist heat and massage.  She is going to start the Eliquis today.  Inetta Fermo, case manager has given her a slip to fill it and she also states her Medicaid should start today so she can fill it.  I did make  a referral to a primary care physician for recheck.  The urine was negative for infection.  She states she has not had any fevers.  She is stable for discharge  and ambulatory out of the ER without difficulty and will return if worsening in any way.   I discussed the results of all labs, procedures, radiographs, and treatments with the patient and available family.  Treatment plan is agreed upon and the patient is ready for discharge.  All voiced understanding of the discharge plan and medication instructions  or changes as appropriate.  Questions about treatment in the ED were answered.  All were encouraged to return should symptoms worsen or new problems develop.

## 2019-04-02 NOTE — ED Notes (Signed)

## 2019-04-02 NOTE — ED Notes (Signed)
Pt to ED with c/o left upper leg pain that radiates down her leg. 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 Pt states left leg pain started about 4 days ago and her thigh is swollen. Pt presents with halter monitor on, denies shortness of breath or chest pain.     Pt masked PTA.

## 2019-04-02 NOTE — Progress Notes (Signed)
SW met with patient who reports difficulty obtaining her Eliquis following admission to [facility]. Patient was uninsured and states it was too expensive. SW met with the patient who states that she has Medicaid now. SW provided the patient with a 30 day free trial offer should she had any difficulty with her insurance.     Henrene Dodge, LMSW  Social Work Case Manager  [place]    * [email or web address]

## 2019-04-05 LAB — PAP IG, CT-NG-TV, RFX APTIMA HPV ASCUS (199325)
CHLAMYDIA, NUC. ACID AMP, 186134: NEGATIVE
GONOCOCCUS, NUC. ACID AMP, 186135: NEGATIVE
LABCORP 019018: 0
TRICH VAG BY NAA: NEGATIVE

## 2019-04-05 LAB — PAP IG, CT-NG-TV, RFX APTIMA HPV ASCUS (183160,507800)
.: 0
Chlamydia, Nuc. Acid Amp: NEGATIVE
Gonococcus, Nuc. Acid Amp: NEGATIVE
Trich vag by NAA: NEGATIVE

## 2019-12-07 ENCOUNTER — Encounter (HOSPITAL_COMMUNITY): Payer: Self-pay

## 2019-12-07 ENCOUNTER — Emergency Department (HOSPITAL_COMMUNITY): Payer: 59

## 2019-12-07 ENCOUNTER — Other Ambulatory Visit: Payer: Self-pay

## 2019-12-07 ENCOUNTER — Emergency Department (HOSPITAL_COMMUNITY)
Admission: EM | Admit: 2019-12-07 | Discharge: 2019-12-07 | Disposition: A | Discharge status: Home / Self Care | Payer: 59 | Attending: Emergency Medicine | Admitting: Emergency Medicine

## 2019-12-07 DIAGNOSIS — R079 Chest pain, unspecified: Secondary | ICD-10-CM

## 2019-12-07 DIAGNOSIS — R0602 Shortness of breath: Secondary | ICD-10-CM | POA: Insufficient documentation

## 2019-12-07 DIAGNOSIS — R0789 Other chest pain: Secondary | ICD-10-CM | POA: Insufficient documentation

## 2019-12-07 DIAGNOSIS — J45909 Unspecified asthma, uncomplicated: Secondary | ICD-10-CM | POA: Insufficient documentation

## 2019-12-07 HISTORY — DX: Unspecified pre-eclampsia, unspecified trimester: O14.90

## 2019-12-07 HISTORY — DX: Other pulmonary embolism without acute cor pulmonale: I26.99

## 2019-12-07 LAB — CBC
HCT: 40.6 % (ref 36.0–46.0)
Hemoglobin: 13.7 g/dL (ref 12.0–15.0)
MCH: 28.9 pg (ref 26.0–34.0)
MCHC: 33.7 g/dL (ref 30.0–36.0)
MCV: 85.7 fL (ref 80.0–100.0)
Platelets: 299 10*3/uL (ref 150–400)
RBC: 4.74 MIL/uL (ref 3.87–5.11)
RDW: 12.1 % (ref 11.5–15.5)
WBC: 6.5 10*3/uL (ref 4.0–10.5)
nRBC: 0 % (ref 0.0–0.2)

## 2019-12-07 LAB — BASIC METABOLIC PANEL
Anion gap: 9 (ref 5–15)
BUN: 19 mg/dL (ref 6–20)
CO2: 24 mmol/L (ref 22–32)
Calcium: 9.9 mg/dL (ref 8.9–10.3)
Chloride: 102 mmol/L (ref 98–111)
Creatinine, Ser: 0.59 mg/dL (ref 0.44–1.00)
GFR, Estimated: >60 mL/min (ref >60)
Glucose, Bld: 100 mg/dL — ABNORMAL HIGH (ref 70–99)
Potassium: 3.5 mmol/L (ref 3.5–5.1)
Sodium: 135 mmol/L (ref 135–145)

## 2019-12-07 LAB — TROPONIN I (HIGH SENSITIVITY)
Troponin I (High Sensitivity): <2 ng/L (ref <18)
Troponin I (High Sensitivity): <2 ng/L (ref <18)

## 2019-12-07 LAB — I-STAT BETA HCG BLOOD, ED (MC, WL, AP ONLY): I-stat hCG, quantitative: <5 m[IU]/mL (ref <5)

## 2019-12-07 MED ORDER — IOHEXOL 350 MG/ML SOLN
100.0000 mL | Freq: Once | INTRAVENOUS | Status: AC | PRN
  Administered 2019-12-07: 100 mL via INTRAVENOUS

## 2019-12-07 MED ORDER — ACETAMINOPHEN 500 MG PO TABS
1000.0000 mg | ORAL_TABLET | Freq: Once | ORAL | Status: AC
  Administered 2019-12-07: 1000 mg via ORAL
  Filled 2019-12-07: qty 2

## 2019-12-07 MED ORDER — SODIUM CHLORIDE (PF) 0.9 % IJ SOLN
INTRAMUSCULAR | Status: AC
  Filled 2019-12-07: qty 50

## 2019-12-07 NOTE — ED Provider Notes (Signed)
[facility] DEPT Provider Note   CSN: [id number] Arrival date & time: 12/07/19  7867     History Chief Complaint  Patient presents with  . Chest Pain  . Shortness of Breath    Olivia Hale is a 29 y.o. female w PMHx PE, preeclampsia, presenting to the ED with complaint of few days of intermittent chest discomfort and shortness of breath. She states she feels a heaviness over her chest that feels as though she needs to catch her breath. Last night it was worse going up the steps and made her feel very winded. Pain is not pleuritic though comes and goes and feels like a pin in the center of her chest. No leg pain or swelling. No cough or fever. No known Covid exposures but she does work in the hospital here on the fifth floor. She had her second Covid shot on Friday, the day after her symptoms began. She has history of PE that occurred while she was admitted for her postpartum stay that was complicated by sepsis and preeclampsia. She states she was on Eliquis for 3 months and had resolution, has not been on anticoagulation since, discontinued by hematology. Symptoms today feel somewhat similar therefore she presents for evaluation.  No known cardiac history.  The history is provided by the patient.       Past Medical History:  Diagnosis Date  . Asthma   . Preeclampsia   . Pulmonary embolism [facility])     Patient Active Problem List   Diagnosis Date Noted  . 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 H/O urinary tract infection 02/14/2015  . H/O previous obstetrical problem 02/14/2015  . Personal history of other infectious and parasitic diseases 02/14/2015    Past Surgical History:  Procedure Laterality Date  . TONSILLECTOMY       OB History    Gravida  5   Para  3   Term  3   Preterm      AB  2   Living  3     SAB  2   TAB      Ectopic      Multiple      Live Births              Family History  Problem Relation Age of Onset  .  Hypertension Mother   . Hypertension Father   . Hyperlipidemia Father     Social History   Tobacco Use  . Smoking status: Never Smoker  . Smokeless tobacco: Never Used  Substance Use Topics  . Alcohol use: Yes    Alcohol/week: 0.0 standard drinks    Comment: occasional  . Drug use: No    Home Medications Prior to Admission medications   Medication Sig Start Date End Date Taking? Authorizing Provider  acetaminophen (TYLENOL) 500 MG tablet Take 1,000 mg by mouth every 6 (six) hours as needed for mild pain, moderate pain or headache.     [provider]  albuterol (PROAIR HFA) 108 (90 Base) MCG/ACT inhaler Inhale 1-2 puffs into the lungs every 6 (six) hours as needed for wheezing or shortness of breath.     [provider]  cephALEXin (KEFLEX) 250 MG capsule Take 1 capsule (250 mg total) by mouth 4 (four) times daily. 05/29/16   Wojeck, Bernadene Bell, NP  Cranberry 1000 MG CAPS Take 2,000 mg by mouth daily.    [provider]  levonorgestrel (MIRENA) 20 MCG/24HR IUD 1 each by Intrauterine route once.  [provider]    Allergies    Patient has no known allergies.  Review of Systems   Review of Systems  All other systems reviewed and are negative.   Physical Exam Updated Vital Signs BP (!) 124/93   Pulse 75   Temp 98.9 F (37.2 C) (Oral)   Resp 18   Ht 4\' 11"  (1.499 m)   Wt 80.7 kg   LMP 12/04/2019   SpO2 100%   BMI 35.95 kg/m   Physical Exam Vitals and nursing note reviewed.  Constitutional:      General: She is not in acute distress.    Appearance: She is well-developed.  HENT:     Head: Normocephalic and atraumatic.  Eyes:     Conjunctiva/sclera: Conjunctivae normal.  Cardiovascular:     Rate and Rhythm: Normal rate and regular rhythm.  Pulmonary:     Effort: Pulmonary effort is normal. No respiratory distress.     Breath sounds: Normal breath sounds.  Abdominal:     Palpations: Abdomen is soft.  Musculoskeletal:      Right lower leg: No edema.     Left lower leg: No edema.     Comments: No calf tenderness.  Skin:    General: Skin is warm.  Neurological:     Mental Status: She is alert.  Psychiatric:        Behavior: Behavior normal.     ED Results / Procedures / Treatments   Labs (all labs ordered are listed, but only abnormal results are displayed) Labs Reviewed  BASIC METABOLIC PANEL - Abnormal; Notable for the following components:      Result Value   Glucose, Bld 100 (*)    All other components within normal limits  CBC  I-STAT BETA HCG BLOOD, ED ([facility], [facility], [facility] ONLY)  TROPONIN I (HIGH SENSITIVITY)  TROPONIN I (HIGH SENSITIVITY)    EKG EKG Interpretation  Date/Time:  Sunday December 07 2019 08:01:21 EDT Ventricular Rate:  79 PR Interval:    QRS Duration: 81 QT Interval:  347 QTC Calculation: 398 R Axis:   49 Text Interpretation: Sinus rhythm Confirmed by Gerlene Fee [phone]) on 12/07/2019 10:47:12 AM   Radiology DG Chest 2 View  Result Date: 12/07/2019 CLINICAL DATA:  Chest pain.  Shortness of breath. EXAM: CHEST - 2 VIEW COMPARISON:  None. FINDINGS: The heart size and mediastinal contours are within normal limits. Both lungs are clear. The visualized skeletal structures are unremarkable. IMPRESSION: No active cardiopulmonary disease. Electronically Signed   By: Dorise Bullion III M.D   On: 12/07/2019 08:53   CT Angio Chest PE W/Cm &/Or Wo Cm  Result Date: 12/07/2019 CLINICAL DATA:  Left sided chest pain with shortness of breath. Previous history of pulmonary embolus. EXAM: CT ANGIOGRAPHY CHEST WITH CONTRAST TECHNIQUE: Multidetector CT imaging of the chest was performed using the standard protocol during bolus administration of intravenous contrast. Multiplanar CT image reconstructions and MIPs were obtained to evaluate the vascular anatomy. CONTRAST:  148mL OMNIPAQUE IOHEXOL 350 MG/ML SOLN COMPARISON:  Current chest radiograph. FINDINGS: Cardiovascular: There is satisfactory  opacification of the pulmonary arteries to the segmental level. There is no evidence of a pulmonary embolism. Normal heart and great vessels. Mediastinum/Nodes: No enlarged mediastinal, hilar, or axillary lymph nodes. Thyroid gland, trachea, and esophagus demonstrate no significant findings. Lungs/Pleura: Lungs are clear. No pleural effusion or pneumothorax. Upper Abdomen: Decreased liver attenuation consistent with fatty infiltration. No acute findings. No other abnormality of the visualized upper abdomen. Musculoskeletal: No  chest wall abnormality. No acute or significant osseous findings. Review of the MIP images confirms the above findings. IMPRESSION: 1. No evidence of a pulmonary embolism.  No acute findings. 2. Hepatic steatosis.  No other abnormalities. Electronically Signed   By: Lajean Manes M.D.   On: 12/07/2019 10:07    Procedures Procedures (including critical care time)  Medications Ordered in ED Medications  sodium chloride (PF) 0.9 % injection (has no administration in time range)  acetaminophen (TYLENOL) tablet 1,000 mg (1,000 mg Oral Given 12/07/19 0921)  iohexol (OMNIPAQUE) 350 MG/ML injection 100 mL (100 mLs Intravenous Contrast Given 12/07/19 0953)    ED Course  I have reviewed the triage vital signs and the nursing notes.  Pertinent labs & imaging results that were available during my care of the patient were reviewed by me and considered in my medical decision making (see chart for details).    MDM Rules/Calculators/A&P                          Patient presenting with a few days of intermittent chest heaviness and feeling winded.  No infectious symptoms.  Has history of PE that occurred during postpartum admission the beginning of this year.  She treated with Eliquis for 3 months and was discontinued per hematology.  On exam, she is well-appearing and in no distress.  Vital signs are stable.  Labs obtained in triage including cardiac enzymes are unremarkable.  Chest  x-ray is negative for infiltrate.  CT of the chest was ordered to evaluate for PE, as patient states symptoms feel similar.  CTA is negative.  She is recommended to follow closely with PCP and return should symptoms worsen.  He is agreeable with this plan, appropriate for discharge.  Discussed results, findings, treatment and follow up. Patient advised of return precautions. Patient verbalized understanding and agreed with plan.  Final Clinical Impression(s) / ED Diagnoses Final diagnoses:  Chest pain, unspecified type  Shortness of breath    Rx / DC Orders ED Discharge Orders    None       Cletis Muma, [place] N, PA-C 12/07/19 1125    Maudie Flakes, MD 12/07/19 1510

## 2019-12-07 NOTE — ED Triage Notes (Signed)
Pt reports intermittent chest pain and SHOB over the past couple of days. Pt states the last time she felt this way she was pregnant. Pt denies any chance of being pregnant today. Pt reports hx of preeclampsia and PE in the past. Pt states she also recently received both Pfizer vaccines.

## 2019-12-07 NOTE — ED Notes (Signed)
Pt transported to CT ?

## 2019-12-07 NOTE — Discharge Instructions (Addendum)
Your blood work and imaging is normal today.  Please follow with your primary care provider regarding your visit today.  Return to the ED if you develop worsening chest pain or shortness of breath.

## 2019-12-17 DIAGNOSIS — R1010 Upper abdominal pain, unspecified: Secondary | ICD-10-CM | POA: Diagnosis not present

## 2019-12-17 DIAGNOSIS — N3000 Acute cystitis without hematuria: Secondary | ICD-10-CM | POA: Diagnosis not present

## 2019-12-17 DIAGNOSIS — N3090 Cystitis, unspecified without hematuria: Secondary | ICD-10-CM | POA: Diagnosis not present

## 2019-12-17 DIAGNOSIS — R718 Other abnormality of red blood cells: Secondary | ICD-10-CM | POA: Diagnosis not present

## 2019-12-17 DIAGNOSIS — D649 Anemia, unspecified: Secondary | ICD-10-CM | POA: Diagnosis not present

## 2020-02-24 ENCOUNTER — Emergency Department (HOSPITAL_COMMUNITY): Payer: Commercial Managed Care - PPO

## 2020-02-24 ENCOUNTER — Emergency Department (HOSPITAL_COMMUNITY)
Admission: EM | Admit: 2020-02-24 | Discharge: 2020-02-24 | Disposition: A | Discharge status: Home / Self Care | Payer: Commercial Managed Care - PPO | Attending: Emergency Medicine | Admitting: Emergency Medicine

## 2020-02-24 ENCOUNTER — Other Ambulatory Visit: Payer: Self-pay

## 2020-02-24 DIAGNOSIS — M25511 Pain in right shoulder: Secondary | ICD-10-CM | POA: Insufficient documentation

## 2020-02-24 DIAGNOSIS — J45909 Unspecified asthma, uncomplicated: Secondary | ICD-10-CM | POA: Diagnosis not present

## 2020-02-24 DIAGNOSIS — Z91010 Allergy to peanuts: Secondary | ICD-10-CM | POA: Insufficient documentation

## 2020-02-24 DIAGNOSIS — W19XXXA Unspecified fall, initial encounter: Secondary | ICD-10-CM

## 2020-02-24 DIAGNOSIS — Y9301 Activity, walking, marching and hiking: Secondary | ICD-10-CM | POA: Insufficient documentation

## 2020-02-24 DIAGNOSIS — W000XXA Fall on same level due to ice and snow, initial encounter: Secondary | ICD-10-CM | POA: Diagnosis not present

## 2020-02-24 DIAGNOSIS — M533 Sacrococcygeal disorders, not elsewhere classified: Secondary | ICD-10-CM | POA: Insufficient documentation

## 2020-02-24 MED ORDER — KETOROLAC TROMETHAMINE 60 MG/2ML IM SOLN
60.0000 mg | Freq: Once | INTRAMUSCULAR | Status: AC
  Administered 2020-02-24: 60 mg via INTRAMUSCULAR
  Filled 2020-02-24: qty 2

## 2020-02-24 MED ORDER — IBUPROFEN 600 MG PO TABS: 600.0000 mg | ORAL_TABLET | Freq: Four times a day (QID) | ORAL | 0 refills | Status: AC | PRN

## 2020-02-24 NOTE — ED Provider Notes (Signed)
[facility] EMERGENCY DEPARTMENT Provider Note   CSN: [id number] Arrival date & time: 02/24/20  1027     History No chief complaint on file.   Olivia Hale is a 30 y.o. female with no relevant past medical history presents the ED after sustaining a fall.  On my examination, patient reports that she was walking into her apartment after her shift last evening when she slipped on ice and fell backward landing on her bottom.  She complained of pain in the area of her sacrum/coccyx.  She also states that her right shoulder felt uncomfortable, but it was not her chief complaint.  She states that her husband had to help her this morning car.  She is weightbearing and ambulatory, although limited due to pain symptoms.  She states that she has significant pain with sitting flat on her butt and needs to lay on her side instead.  She denies any chest or back pain, abdominal pain, numbness or weakness, head injury or headache, loss consciousness, dizziness, blurred vision, or other symptoms.  HPI     Past Medical History:  Diagnosis Date  . Asthma   . Preeclampsia   . Pulmonary embolism [facility])     Patient Active Problem List   Diagnosis Date Noted  . 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 H/O urinary tract infection 02/14/2015  . H/O previous obstetrical problem 02/14/2015  . Personal history of other infectious and parasitic diseases 02/14/2015    Past Surgical History:  Procedure Laterality Date  . TONSILLECTOMY       OB History    Gravida  5   Para  3   Term  3   Preterm      AB  2   Living  3     SAB  2   IAB      Ectopic      Multiple      Live Births              Family History  Problem Relation Age of Onset  . Hypertension Mother   . Hypertension Father   . Hyperlipidemia Father     Social History   Tobacco Use  . Smoking status: Never Smoker  . Smokeless tobacco: Never Used  Substance Use Topics  . Alcohol use: Yes     Alcohol/week: 0.0 standard drinks    Comment: occasional  . Drug use: No    Home Medications Prior to Admission medications   Medication Sig Start Date End Date Taking? Authorizing Provider  albuterol (VENTOLIN HFA) 108 (90 Base) MCG/ACT inhaler Inhale 1-2 puffs into the lungs every 6 (six) hours as needed for wheezing or shortness of breath.    Yes [provider]  Ferrous Sulfate ([employer] SLOW RELEASE IRON PO) Take by mouth.   Yes [provider]  ibuprofen (ADVIL) 600 MG tablet Take 1 tablet (600 mg total) by mouth every 6 (six) hours as needed. 02/24/20  Yes Corena Herter, PA-C  cephALEXin (KEFLEX) 250 MG capsule Take 1 capsule (250 mg total) by mouth 4 (four) times daily. Patient not taking: Reported on 02/24/2020 05/29/16   Wojeck, Bernadene Bell, NP    Allergies    Other, Amoxicillin-pot clavulanate, and Peanut-containing drug products  Review of Systems   Review of Systems  All other systems reviewed and are negative.   Physical Exam Updated Vital Signs BP 110/60   Pulse 71   Temp 98.6 F (37 C) (Oral)   Resp 16  SpO2 100%   Physical Exam Vitals and nursing note reviewed. Exam conducted with a chaperone present.  Constitutional:      Appearance: Normal appearance.  HENT:     Head: Normocephalic and atraumatic.  Eyes:     General: No scleral icterus.    Conjunctiva/sclera: Conjunctivae normal.  Cardiovascular:     Rate and Rhythm: Normal rate.     Pulses: Normal pulses.  Pulmonary:     Effort: Pulmonary effort is normal. No respiratory distress.  Musculoskeletal:     Comments: TTP over sacrum.  No bruising, swelling, or other overlying skin changes.  Able to flex and extend right and left legs.  No other areas of midline spinal TTP.  Peripheral pulses intact and symmetric.  Sensation intact throughout.  Skin:    General: Skin is dry.     Capillary Refill: Capillary refill takes less than 2 seconds.  Neurological:     Mental Status: She is alert and  oriented to person, place, and time.     GCS: GCS eye subscore is 4. GCS verbal subscore is 5. GCS motor subscore is 6.  Psychiatric:        Mood and Affect: Mood normal.        Behavior: Behavior normal.        Thought Content: Thought content normal.     ED Results / Procedures / Treatments   Labs (all labs ordered are listed, but only abnormal results are displayed) Labs Reviewed - No data to display  EKG None  Radiology DG Sacrum/Coccyx  Result Date: 02/24/2020 CLINICAL DATA:  Fall.  Pain. EXAM: SACRUM AND COCCYX - 2+ VIEW COMPARISON:  No prior. FINDINGS: No acute bony abnormality identified. No evidence of fracture. Mild sclerotic changes noted about both SI joints. Sacroiliitis cannot be excluded. IMPRESSION: 1. No acute abnormality identified. 2. Mild sclerotic changes noted about both SI joints. Sacroiliitis cannot be excluded. Electronically Signed   By: Marcello Moores  Register   On: 02/24/2020 11:07   DG Shoulder Right  Result Date: 02/24/2020 CLINICAL DATA:  Fall on ice. EXAM: RIGHT SHOULDER - 2+ VIEW COMPARISON:  None. FINDINGS: There is no evidence of fracture or dislocation. There is no evidence of arthropathy or other focal bone abnormality. Soft tissues are unremarkable. IMPRESSION: Negative. Electronically Signed   By: Misty Stanley M.D.   On: 02/24/2020 11:07    Procedures Procedures (including critical care time)  Medications Ordered in ED Medications  ketorolac (TORADOL) injection 60 mg (60 mg Intramuscular Given 02/24/20 1324)    ED Course  I have reviewed the triage vital signs and the nursing notes.  Pertinent labs & imaging results that were available during my care of the patient were reviewed by me and considered in my medical decision making (see chart for details).    MDM Rules/Calculators/A&P                          Patient in the ER after sustaining mechanical fall.  Plain films of right shoulder and sacrum/coccyx are without any acute bony  abnormalities.  However, there are mild sclerotic changes noted about both SI joints and sacroiliitis cannot be excluded.  She does not have any significant tenderness over her SI joints and instead having focally located over her sacrum.  I personally reviewed plain films and did not see any fracture.  Patient endorses mild discomfort with defecation.  Encouraging increased oral hydration and stool softeners as needed.  Patient feeling improved after IM Toradol here in the ED.  She denies possibility of pregnancy.  She will follow-up with her primary care provider regarding today's encounter for ongoing evaluation and management.  We will also refer her to orthopedic should her symptoms fail to improve with conservative therapy.  ED return precautions discussed.  Patient voices understanding and is agreeable to the plan.  Final Clinical Impression(s) / ED Diagnoses Final diagnoses:  Fall, initial encounter  Traumatic coccydynia    Rx / DC Orders ED Discharge Orders         Ordered    ibuprofen (ADVIL) 600 MG tablet  Every 6 hours PRN        02/24/20 1355           Corena Herter, PA-C 02/24/20 1359    Horton, Alvin Critchley, DO 02/25/20 [phone]

## 2020-02-24 NOTE — ED Notes (Signed)
Complaints of coccyx pain and R shoulder pain.   Able to move all extremities and no obvious deformities  A0x4, did not hit her head

## 2020-02-24 NOTE — ED Triage Notes (Signed)
Pt fell last night walking into her house after work , awoke this am with right shoulder pian and coccyx pain

## 2020-02-24 NOTE — Discharge Instructions (Addendum)
Please read the attachment on tailbone injury management.  Please drink plenty of fluids and consider stool softeners to help ease discomfort with defecation.  Please take the medications, as directed.  Follow-up with your primary care provider regarding today's ED encounter.  I have also provided you with a referral to Dr. Victorino December with [facility] should your symptoms fail to improve with conservative therapy.  Return to the ED or seek immediate medical attention should you experience any new or worsening symptoms.

## 2022-10-21 IMAGING — DX DG SHOULDER 2+V*R*
4 series · 4 of 4 positions shown · non-contrast
Comparison: None.

CLINICAL DATA: Fall on ice.

EXAM:
RIGHT SHOULDER - 2+ VIEW

[w shoulder internal right]
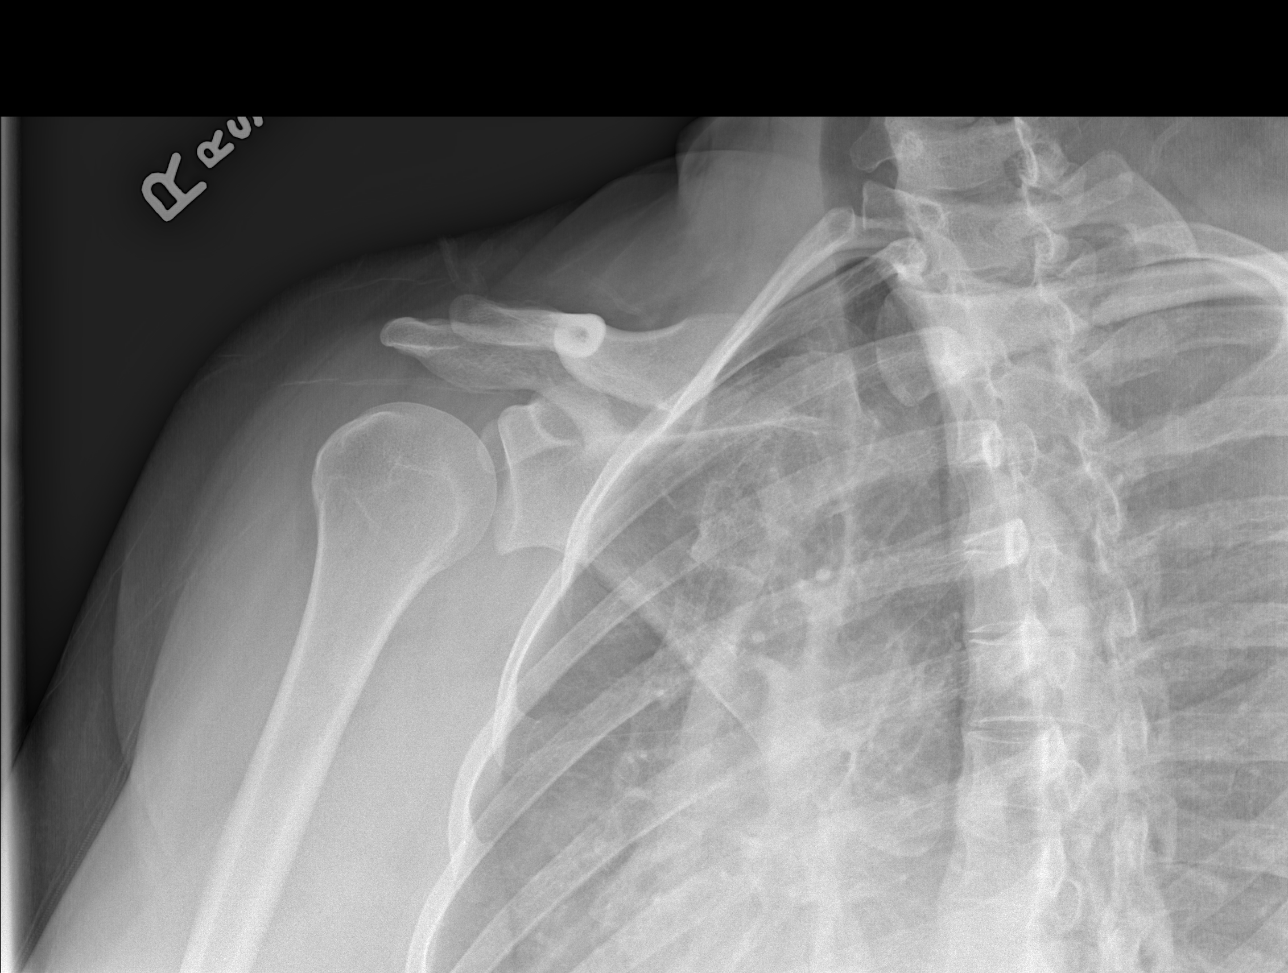

[w shoulder y-view right (1 of 2)]
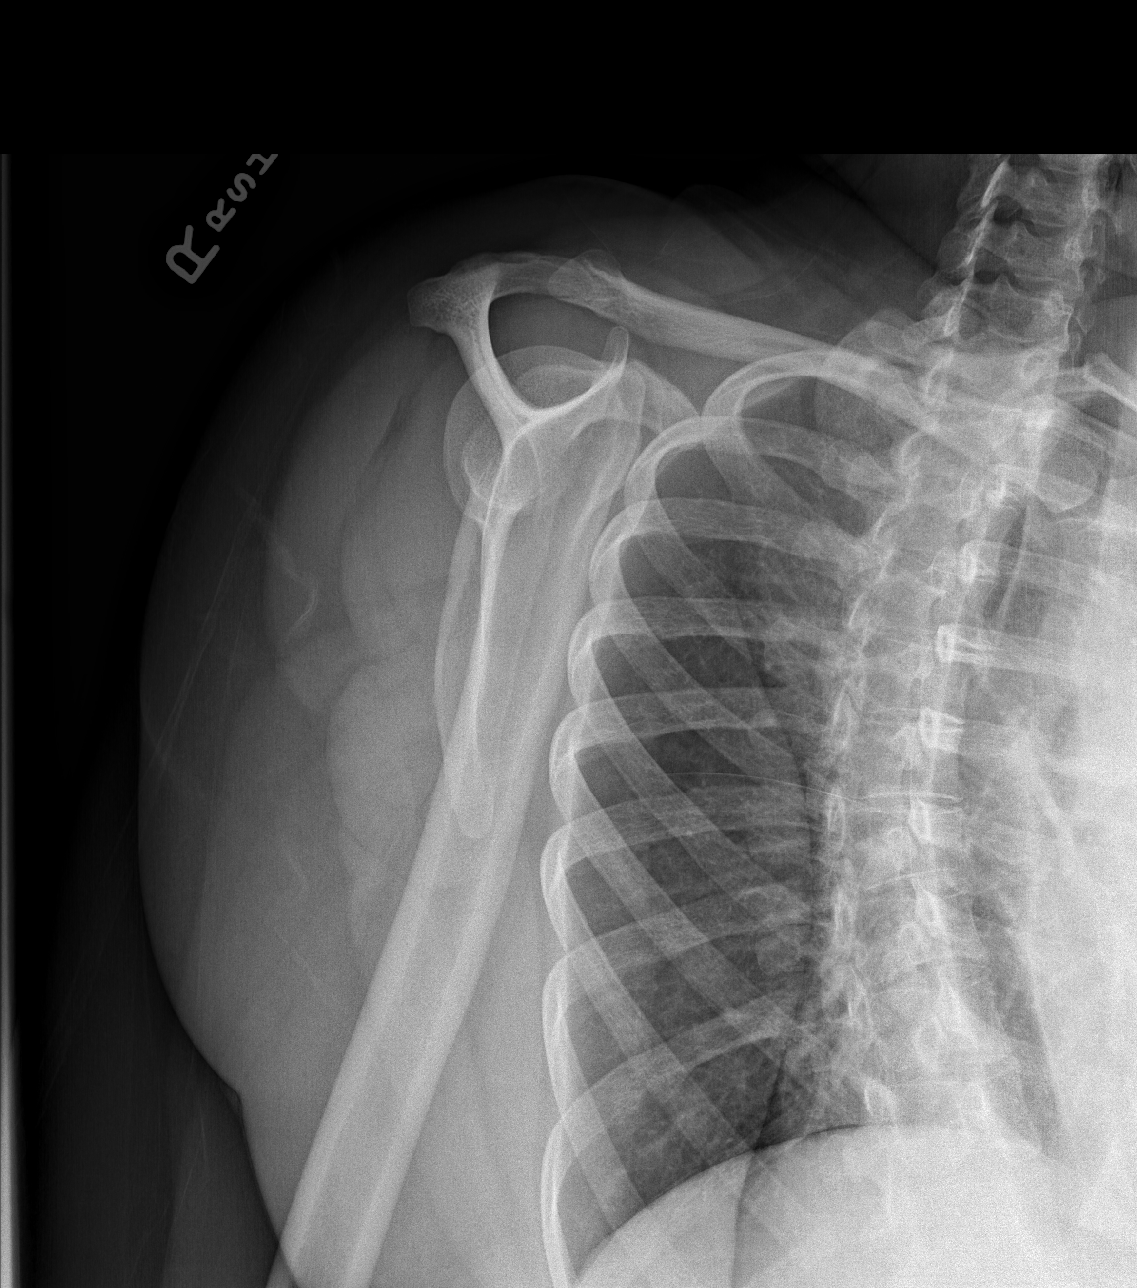

[w shoulder y-view right (2 of 2)]
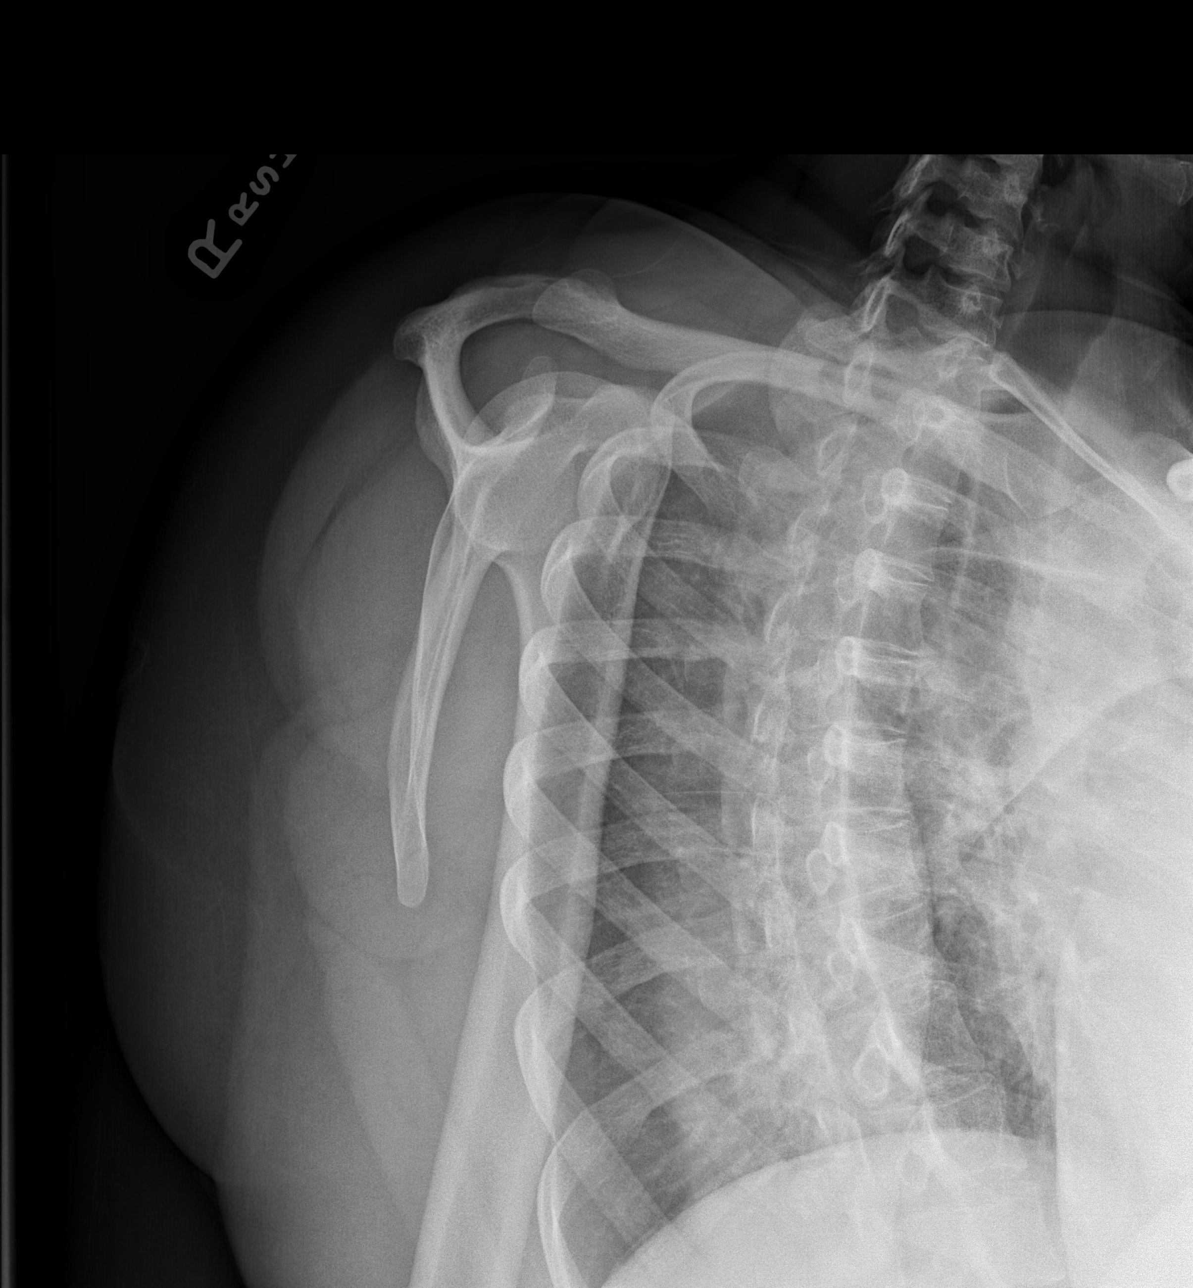

[x shoulder axillary right]
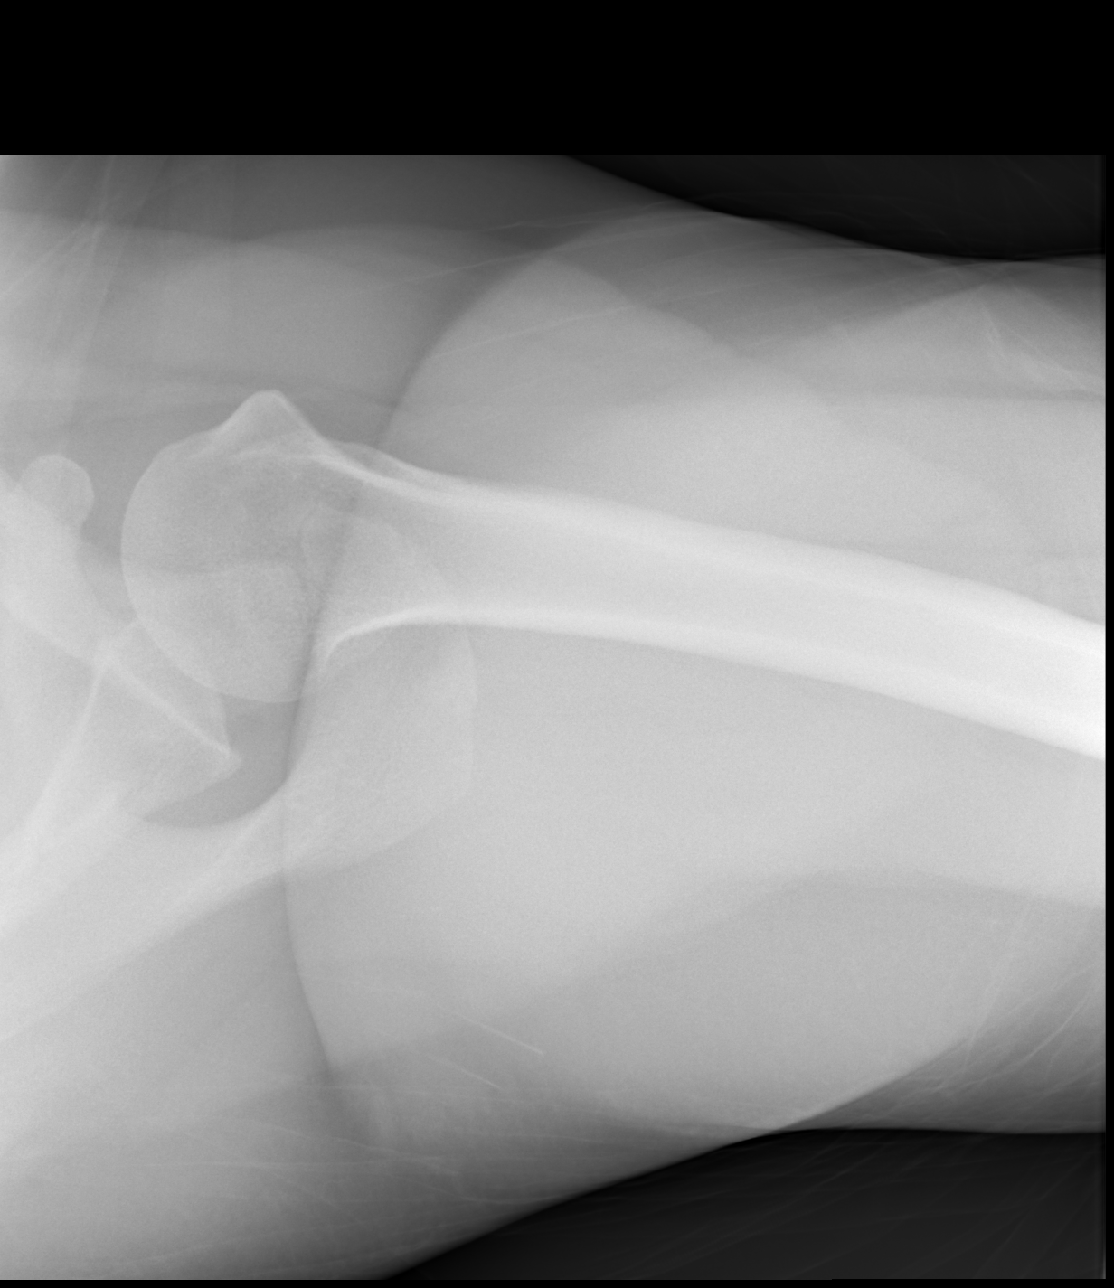

[4 of 4 positions shown; findings below may reference images not displayed]

FINDINGS: There is no evidence of fracture or dislocation. There is no
evidence of arthropathy or other focal bone abnormality. Soft
tissues are unremarkable.
IMPRESSION: Negative.
